# Patient Record
Sex: Male | Born: 1950 | Race: White | Hispanic: No | Marital: Married | Smoking: Never smoker
Health system: Southern US, Community
[De-identification: ages and names within clinical notes are randomized; demographics above are authoritative.]

## PROBLEM LIST (undated history)

## (undated) DIAGNOSIS — E785 Hyperlipidemia, unspecified: Secondary | ICD-10-CM

## (undated) DIAGNOSIS — H269 Unspecified cataract: Secondary | ICD-10-CM

## (undated) DIAGNOSIS — Z95820 Peripheral vascular angioplasty status with implants and grafts: Secondary | ICD-10-CM

## (undated) DIAGNOSIS — I2510 Atherosclerotic heart disease of native coronary artery without angina pectoris: Secondary | ICD-10-CM

## (undated) DIAGNOSIS — N400 Enlarged prostate without lower urinary tract symptoms: Secondary | ICD-10-CM

## (undated) DIAGNOSIS — N200 Calculus of kidney: Secondary | ICD-10-CM

## (undated) DIAGNOSIS — I213 ST elevation (STEMI) myocardial infarction of unspecified site: Secondary | ICD-10-CM

## (undated) DIAGNOSIS — D126 Benign neoplasm of colon, unspecified: Secondary | ICD-10-CM

## (undated) DIAGNOSIS — T7840XA Allergy, unspecified, initial encounter: Secondary | ICD-10-CM

## (undated) HISTORY — PX: NO PAST SURGERIES: SHX2092

## (undated) HISTORY — PX: CATARACT EXTRACTION: SUR2

## (undated) HISTORY — DX: Unspecified cataract: H26.9

## (undated) HISTORY — DX: Benign neoplasm of colon, unspecified: D12.6

## (undated) HISTORY — DX: Allergy, unspecified, initial encounter: T78.40XA

---

## 1999-07-11 ENCOUNTER — Encounter: Admission: RE | Admit: 1999-07-11 | Discharge: 1999-08-01 | Payer: Self-pay | Admitting: Family Medicine

## 2003-01-17 ENCOUNTER — Encounter: Admission: RE | Admit: 2003-01-17 | Discharge: 2003-03-03 | Payer: Self-pay | Admitting: Family Medicine

## 2004-06-09 DIAGNOSIS — D126 Benign neoplasm of colon, unspecified: Secondary | ICD-10-CM

## 2004-06-09 HISTORY — DX: Benign neoplasm of colon, unspecified: D12.6

## 2005-01-09 ENCOUNTER — Ambulatory Visit: Payer: Self-pay | Admitting: Family Medicine

## 2005-01-27 ENCOUNTER — Ambulatory Visit: Payer: Self-pay | Admitting: Gastroenterology

## 2005-02-21 ENCOUNTER — Encounter (INDEPENDENT_AMBULATORY_CARE_PROVIDER_SITE_OTHER): Payer: Self-pay | Admitting: *Deleted

## 2005-02-21 ENCOUNTER — Ambulatory Visit: Payer: Self-pay | Admitting: Gastroenterology

## 2005-03-05 ENCOUNTER — Ambulatory Visit: Payer: Self-pay | Admitting: Family Medicine

## 2005-03-13 ENCOUNTER — Ambulatory Visit: Payer: Self-pay | Admitting: Family Medicine

## 2005-05-14 ENCOUNTER — Ambulatory Visit: Payer: Self-pay | Admitting: Family Medicine

## 2005-05-30 ENCOUNTER — Ambulatory Visit (HOSPITAL_BASED_OUTPATIENT_CLINIC_OR_DEPARTMENT_OTHER): Admission: RE | Admit: 2005-05-30 | Discharge: 2005-05-30 | Payer: Self-pay | Admitting: Orthopedic Surgery

## 2005-05-30 ENCOUNTER — Ambulatory Visit (HOSPITAL_COMMUNITY): Admission: RE | Admit: 2005-05-30 | Discharge: 2005-05-30 | Payer: Self-pay | Admitting: Orthopedic Surgery

## 2005-07-03 ENCOUNTER — Encounter: Admission: RE | Admit: 2005-07-03 | Discharge: 2005-07-28 | Payer: Self-pay | Admitting: Orthopedic Surgery

## 2009-05-27 ENCOUNTER — Emergency Department (HOSPITAL_COMMUNITY): Admission: EM | Admit: 2009-05-27 | Discharge: 2009-05-27 | Payer: Self-pay | Admitting: Emergency Medicine

## 2010-01-22 ENCOUNTER — Encounter (INDEPENDENT_AMBULATORY_CARE_PROVIDER_SITE_OTHER): Payer: Self-pay | Admitting: *Deleted

## 2010-07-09 NOTE — Letter (Signed)
Summary: Colonoscopy Letter  [facility]  [address] [place], [place] [address]   Phone: [phone]  Fax: [phone]      January 22, 2010 MRN: [id number]   [facility] [address] [place], [place]  [address]   Dear Blake Evans,   According to your medical record, it is time for you to schedule a Colonoscopy. The American Cancer Society recommends this procedure as a method to detect early colon cancer. Patients with a family history of colon cancer, or a personal history of colon polyps or inflammatory bowel disease are at increased risk.  This letter has beeen generated based on the recommendations made at the time of your procedure. If you feel that in your particular situation this may no longer apply, please contact our office.  Please call our office at [phone] to schedule this appointment or to update your records at your earliest convenience.  Thank you for cooperating with [place] to provide you with the very best care possible.   Sincerely,  Judie Petit T. Russella Dar, M.D.  [facility] [phone]

## 2010-09-09 LAB — URINE CULTURE
Colony Count: NO GROWTH
Culture: NO GROWTH

## 2010-09-09 LAB — URINE MICROSCOPIC-ADD ON

## 2010-09-09 LAB — URINALYSIS, ROUTINE W REFLEX MICROSCOPIC
Bilirubin Urine: NEGATIVE
Glucose, UA: NEGATIVE mg/dL
Ketones, ur: 15 mg/dL — AB
Nitrite: NEGATIVE
Protein, ur: 30 mg/dL — AB
Specific Gravity, Urine: 1.025 (ref 1.005–1.030)
Urobilinogen, UA: 1 mg/dL (ref 0.0–1.0)
pH: 6.5 (ref 5.0–8.0)

## 2010-10-25 NOTE — Op Note (Signed)
NAMESWAYZE, PRIES                 ACCOUNT NO.:  [id number]   MEDICAL RECORD NO.:  [id number]          PATIENT TYPE:  AMB   LOCATION:  DSC                          FACILITY:  [facility]   PHYSICIAN:  Robert A. Thurston Hole, M.D. 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   PROCEDURE:  Right elbow EUA followed by lateral epicondylar release,  debridement and repair with partial lateral epicondylectomy.   SURGEON:  Elana Alm. Thurston Hole, M.D.   ASSISTANT:  Gifford Shave, P.A.   ANESTHESIA:  General.   OPERATIVE TIME:  45 minutes.   COMPLICATIONS:  None.   INDICATIONS FOR PROCEDURE:  Mr. Fix is a 60 year old gentleman who has had  significant right elbow pain laterally, longstanding, getting worse, with  exam and MRI done documenting a partial tear of the lateral epicondylar  tendons, who has failed conservative tear and is now to undergo debridement,  lateral epicondylar release, repair and partial lateral epicondylectomy.  Of  note, this morning he had a small amount of erythema along his olecranon  bursa, no definite cellulitis, but I elected to treat him with perioperative  antibiotics because of this.   Mr. Villalva is brought to the operating room on May 30, 2005, placed on  the operating table in the supine position.  After an adequate level of  general anesthesia was obtained, his right elbow was examined.  He had full  range of motion and his elbow was stable to ligamentous exam.  Right arm was  prepped using sterile DuraPrep and draped using sterile technique.  The arm  was exsanguinated and tourniquet elevated to 275 mmHg.  Initially through a  3- to 4-cm curvilinear anterolateral incision based over the lateral  epicondyle, initial exposure was made.  The underlying subcutaneous tissues  were incised in line with the skin incision.  The fascia over the lateral  epicondylar tendons was incised longitudinally and then retracted.  The ECRB  and ECRL tendons were then exposed.  They were carefully released off their  lateral epicondylar insertion and radial nerve carefully protected while  this was done.  There was found to be significant tendinosis and partial  tearing, and this was thoroughly debrided back to healthy-appearing tendon.  The radial-side capitellum joint was not entered.  After this debridement  had been carried out, underlying bone spur noted on lateral epicondyle was  resected, and then multiple small drill holes were placed in the lateral  epicondyle.  After this was done, then #2 FiberWire was passed through these  drill holes and through the ECRB and ECRL tendon in a mattress suture  technique and then tied down over bone, thus securing the lateral  epicondylar tendons back to the lateral epicondyle approximately 3 mm distal  to their original insertion under less tension.  After this had been done,  it was  felt that a complete and satisfactory debridement and repair had been  carried out.  The wound was then irrigated.  The fascia was then closed over  this repair with 2-0 Vicryl suture.  Subcutaneous tissues were closed with 2-  0 Vicryl.  Subcuticular layer was closed with 3-0 Monocryl.  Steri-Strips  were applied.  The wound was injected with 0.25% Marcaine.  Sterile  dressings and a long-arm splint applied.  Tourniquet was released.  Then the  patient was awakened and taken to the recovery room in stable condition.   FOLLOWUP CARE:  Mr. Peplinski will be followed as an outpatient on Percocet and  Keflex.  Will see him back in the office in a week for wound check and  followup.      Robert A. Thurston Hole, M.D.  Electronically Signed     RAW/MEDQ  D:  05/30/2005  T:  06/02/2005   Job:  [id number]

## 2011-07-28 ENCOUNTER — Ambulatory Visit (INDEPENDENT_AMBULATORY_CARE_PROVIDER_SITE_OTHER): Payer: 59 | Admitting: Physician Assistant

## 2011-07-28 VITALS — BP 147/90 | HR 77 | Temp 99.3°F | Resp 16 | Ht 71.0 in | Wt 176.0 lb

## 2011-07-28 DIAGNOSIS — J019 Acute sinusitis, unspecified: Secondary | ICD-10-CM

## 2011-07-28 DIAGNOSIS — R05 Cough: Secondary | ICD-10-CM

## 2011-07-28 DIAGNOSIS — R059 Cough, unspecified: Secondary | ICD-10-CM

## 2011-07-28 MED ORDER — BENZONATATE 100 MG PO CAPS: 100.0000 mg | ORAL_CAPSULE | Freq: Three times a day (TID) | ORAL | Status: AC | PRN

## 2011-07-28 MED ORDER — CEFDINIR 300 MG PO CAPS: 300.0000 mg | ORAL_CAPSULE | Freq: Two times a day (BID) | ORAL | Status: AC

## 2011-07-28 MED ORDER — IPRATROPIUM BROMIDE 0.06 % NA SOLN: 2.0000 | Freq: Two times a day (BID) | NASAL | Status: DC

## 2011-07-28 NOTE — Patient Instructions (Signed)

## 2011-07-28 NOTE — Progress Notes (Signed)
Patient ID: Blake Evans MRN: [id number], DOB: [birth year], 61 y.o. Date of Encounter: 07/28/2011, 8:43 AM  Primary Physician: Dr. Tawanna Cooler  Chief Complaint:  Chief Complaint  Patient presents with  . URI    * 1 week- worse yesterday  . Generalized Body Aches  . Fever    HPI: 61 y.o. year old male presents with 1 week history of worsening nasal congestion, sinus pressure, and post nasal drip. Symptoms worsened the previous night when he developed a fever of 101. This resolved with Tylenol. He notes a mild cough that is nonproductive. Main symptom currently is frontal sinus pressure that is worsening. Nasal congestion is thick and clear to white. Bilateral ears feel full. Has tried Nyquil/Dayquil without much success. Normal appetite. No nausea or vomiting. No GI symptoms. No sick contacts. Has up coming vacation to the mountains in 3 days.  Daughter is currently in [employer] school in [place].   No leg trauma, sedentary periods, h/o cancer, or tobacco use.  No past medical history on file.   Home Meds: Prior to Admission medications   Medication Sig Start Date End Date Taking? Authorizing Provider  Dutasteride-Tamsulosin HCl (JALYN) 0.5-0.4 MG CAPS Take 1 capsule by mouth daily.   Yes Historical Provider, MD  Multiple Vitamin (MULTIVITAMIN) tablet Take 1 tablet by mouth daily.   Yes Historical Provider, MD  saw palmetto 80 MG capsule Take 80 mg by mouth daily.   Yes Historical Provider, MD    Allergies: No Known Allergies  History   Social History  . Marital Status: Single    Spouse Name: N/A    Number of Children: N/A  . Years of Education: N/A   Occupational History  . Not on file.   Social History Main Topics  . Smoking status: Never Smoker   . Smokeless tobacco: Not on file  . Alcohol Use: Not on file  . Drug Use: Not on file  . Sexually Active: Not on file   Other Topics Concern  . Not on file   Social History Narrative  . No narrative on file     Review of  Systems: Constitutional: negative for chills, fever, night sweats or weight changes Cardiovascular: negative for chest pain or palpitations Respiratory: negative for hemoptysis, wheezing, or shortness of breath Abdominal: negative for abdominal pain, nausea, vomiting or diarrhea Dermatological: negative for rash Neurologic: negative for headache   Physical Exam: Blood pressure 147/90, pulse 77, temperature 99.3 F (37.4 C), temperature source Oral, resp. rate 16, height 5\' 11"  (1.803 m), weight 176 lb (79.833 kg)., Body mass index is 24.55 kg/(m^2). General: Well developed, well nourished, in no acute distress. Head: Normocephalic, atraumatic, eyes without discharge, sclera non-icteric, nares are congested. Bilateral auditory canals clear, TM's are without perforation, pearly grey with reflective cone of light bilaterally. Serous effusion bilateral TM. Frontal sinus TTP bilaterally.Oral cavity moist, dentition normal. Posterior pharynx with post nasal drip and mild erythema. No peritonsillar abscess or tonsillar exudate. Neck: Supple. No thyromegaly. Full ROM. No lymphadenopathy. Lungs: Clear bilaterally to auscultation without wheezes, rales, or rhonchi. Breathing is unlabored. Heart: RRR with S1 S2. No murmurs, rubs, or gallops appreciated. Msk:  Strength and tone normal for age. Extremities: No clubbing or cyanosis. No edema. Neuro: Alert and oriented X 3. Moves all extremities spontaneously. CNII-XII grossly in tact. Psych:  Responds to questions appropriately with a normal affect.    ASSESSMENT AND PLAN:  61 y.o. year old male with early sinusitis. -Omnicef 300 mg 1  po bid #20 no RF  -Atrovent NS 0.06% 2 sprays each nare bid prn #1 no RF -Tessalon Perles 100 mg 1 po tid prn cough #30 no RF  -Mucinex -Tylenol/Motrin prn -Rest/fluids -RTC precautions -RTC 3-5 days if no improvement  Signed, Eula Listen, PA-C 07/28/2011 8:43 AM

## 2011-11-21 ENCOUNTER — Inpatient Hospital Stay (HOSPITAL_COMMUNITY)
Admission: EM | Admit: 2011-11-21 | Discharge: 2011-11-25 | DRG: 247 | Disposition: A | Discharge status: Home / Self Care | Payer: 59 | Source: Ambulatory Visit | Attending: Cardiovascular Disease | Admitting: Cardiovascular Disease

## 2011-11-21 ENCOUNTER — Other Ambulatory Visit: Payer: Self-pay

## 2011-11-21 ENCOUNTER — Emergency Department (HOSPITAL_COMMUNITY): Payer: 59

## 2011-11-21 ENCOUNTER — Inpatient Hospital Stay (HOSPITAL_COMMUNITY): Payer: 59

## 2011-11-21 ENCOUNTER — Encounter (HOSPITAL_COMMUNITY)
Admission: EM | Disposition: A | Discharge status: Home / Self Care | Payer: Self-pay | Source: Ambulatory Visit | Attending: Cardiovascular Disease

## 2011-11-21 ENCOUNTER — Encounter (HOSPITAL_COMMUNITY): Payer: Self-pay | Admitting: Emergency Medicine

## 2011-11-21 DIAGNOSIS — I2510 Atherosclerotic heart disease of native coronary artery without angina pectoris: Secondary | ICD-10-CM | POA: Diagnosis present

## 2011-11-21 DIAGNOSIS — I2119 ST elevation (STEMI) myocardial infarction involving other coronary artery of inferior wall: Principal | ICD-10-CM | POA: Diagnosis present

## 2011-11-21 DIAGNOSIS — R001 Bradycardia, unspecified: Secondary | ICD-10-CM

## 2011-11-21 DIAGNOSIS — Z8249 Family history of ischemic heart disease and other diseases of the circulatory system: Secondary | ICD-10-CM

## 2011-11-21 DIAGNOSIS — E785 Hyperlipidemia, unspecified: Secondary | ICD-10-CM | POA: Diagnosis present

## 2011-11-21 DIAGNOSIS — N400 Enlarged prostate without lower urinary tract symptoms: Secondary | ICD-10-CM | POA: Diagnosis present

## 2011-11-21 DIAGNOSIS — I213 ST elevation (STEMI) myocardial infarction of unspecified site: Secondary | ICD-10-CM | POA: Diagnosis present

## 2011-11-21 DIAGNOSIS — Z95820 Peripheral vascular angioplasty status with implants and grafts: Secondary | ICD-10-CM

## 2011-11-21 HISTORY — PX: CORONARY ANGIOPLASTY WITH STENT PLACEMENT: SHX49

## 2011-11-21 HISTORY — DX: Atherosclerotic heart disease of native coronary artery without angina pectoris: I25.10

## 2011-11-21 HISTORY — DX: ST elevation (STEMI) myocardial infarction of unspecified site: I21.3

## 2011-11-21 HISTORY — DX: Calculus of kidney: N20.0

## 2011-11-21 HISTORY — DX: Hyperlipidemia, unspecified: E78.5

## 2011-11-21 HISTORY — DX: Benign prostatic hyperplasia without lower urinary tract symptoms: N40.0

## 2011-11-21 HISTORY — PX: PERCUTANEOUS CORONARY STENT INTERVENTION (PCI-S): SHX5485

## 2011-11-21 HISTORY — PX: LEFT HEART CATHETERIZATION WITH CORONARY ANGIOGRAM: SHX5451

## 2011-11-21 HISTORY — DX: Peripheral vascular angioplasty status with implants and grafts: Z95.820

## 2011-11-21 LAB — URINALYSIS, ROUTINE W REFLEX MICROSCOPIC
Bilirubin Urine: NEGATIVE
Bilirubin Urine: NEGATIVE
Glucose, UA: NEGATIVE mg/dL
Glucose, UA: NEGATIVE mg/dL
Ketones, ur: NEGATIVE mg/dL
Ketones, ur: NEGATIVE mg/dL
Leukocytes, UA: NEGATIVE
Leukocytes, UA: NEGATIVE
Nitrite: NEGATIVE
Nitrite: NEGATIVE
Protein, ur: NEGATIVE mg/dL
Protein, ur: NEGATIVE mg/dL
Specific Gravity, Urine: 1.046 — ABNORMAL HIGH (ref 1.005–1.030)
Specific Gravity, Urine: 1.023 (ref 1.005–1.030)
Urobilinogen, UA: 0.2 mg/dL (ref 0.0–1.0)
Urobilinogen, UA: 0.2 mg/dL (ref 0.0–1.0)
pH: 6.5 (ref 5.0–8.0)
pH: 7.5 (ref 5.0–8.0)

## 2011-11-21 LAB — CBC
HCT: 40.4 % (ref 39.0–52.0)
HCT: 44 % (ref 39.0–52.0)
Hemoglobin: 14.4 g/dL (ref 13.0–17.0)
Hemoglobin: 15.6 g/dL (ref 13.0–17.0)
MCH: 30.7 pg (ref 26.0–34.0)
MCH: 31 pg (ref 26.0–34.0)
MCHC: 35.5 g/dL (ref 30.0–36.0)
MCHC: 35.6 g/dL (ref 30.0–36.0)
MCV: 86.1 fL (ref 78.0–100.0)
MCV: 87.3 fL (ref 78.0–100.0)
Platelets: 181 10*3/uL (ref 150–400)
Platelets: 222 10*3/uL (ref 150–400)
RBC: 4.69 MIL/uL (ref 4.22–5.81)
RBC: 5.04 MIL/uL (ref 4.22–5.81)
RDW: 12.6 % (ref 11.5–15.5)
RDW: 12.8 % (ref 11.5–15.5)
WBC: 11.1 10*3/uL — ABNORMAL HIGH (ref 4.0–10.5)
WBC: 14.1 10*3/uL — ABNORMAL HIGH (ref 4.0–10.5)

## 2011-11-21 LAB — COMPREHENSIVE METABOLIC PANEL
ALT: 22 U/L (ref 0–53)
ALT: 32 U/L (ref 0–53)
AST: 23 U/L (ref 0–37)
AST: 87 U/L — ABNORMAL HIGH (ref 0–37)
Albumin: 3.9 g/dL (ref 3.5–5.2)
Albumin: 3.7 g/dL (ref 3.5–5.2)
Alkaline Phosphatase: 55 U/L (ref 39–117)
Alkaline Phosphatase: 50 U/L (ref 39–117)
BUN: 14 mg/dL (ref 6–23)
BUN: 13 mg/dL (ref 6–23)
CO2: 25 mEq/L (ref 19–32)
CO2: 21 mEq/L (ref 19–32)
Calcium: 9 mg/dL (ref 8.4–10.5)
Calcium: 8.4 mg/dL (ref 8.4–10.5)
Chloride: 99 mEq/L (ref 96–112)
Chloride: 100 mEq/L (ref 96–112)
Creatinine, Ser: 0.71 mg/dL (ref 0.50–1.35)
Creatinine, Ser: 0.67 mg/dL (ref 0.50–1.35)
GFR calc Af Amer: >90 mL/min (ref >90)
GFR calc Af Amer: >90 mL/min (ref >90)
GFR calc non Af Amer: >90 mL/min (ref >90)
GFR calc non Af Amer: >90 mL/min (ref >90)
Glucose, Bld: 103 mg/dL — ABNORMAL HIGH (ref 70–99)
Glucose, Bld: 129 mg/dL — ABNORMAL HIGH (ref 70–99)
Potassium: 3.5 mEq/L (ref 3.5–5.1)
Potassium: 4.2 mEq/L (ref 3.5–5.1)
Sodium: 137 mEq/L (ref 135–145)
Sodium: 134 mEq/L — ABNORMAL LOW (ref 135–145)
Total Bilirubin: 1.1 mg/dL (ref 0.3–1.2)
Total Bilirubin: 1 mg/dL (ref 0.3–1.2)
Total Protein: 7.1 g/dL (ref 6.0–8.3)
Total Protein: 6.3 g/dL (ref 6.0–8.3)

## 2011-11-21 LAB — HEMOGLOBIN A1C
Hgb A1c MFr Bld: 5.3 % (ref <5.7)
Mean Plasma Glucose: 105 mg/dL (ref <117)

## 2011-11-21 LAB — MAGNESIUM: Magnesium: 1.7 mg/dL (ref 1.5–2.5)

## 2011-11-21 LAB — CARDIAC PANEL(CRET KIN+CKTOT+MB+TROPI)
CK, MB: 57.7 ng/mL (ref 0.3–4.0)
CK, MB: 157.7 ng/mL (ref 0.3–4.0)
CK, MB: 102.9 ng/mL (ref 0.3–4.0)
Relative Index: 5.8 — ABNORMAL HIGH (ref 0.0–2.5)
Relative Index: 7 — ABNORMAL HIGH (ref 0.0–2.5)
Relative Index: 6.6 — ABNORMAL HIGH (ref 0.0–2.5)
Total CK: 996 U/L — ABNORMAL HIGH (ref 7–232)
Total CK: 2246 U/L — ABNORMAL HIGH (ref 7–232)
Total CK: 1563 U/L — ABNORMAL HIGH (ref 7–232)
Troponin I: 4.79 ng/mL (ref <0.30)
Troponin I: >25 ng/mL (ref <0.30)
Troponin I: >25 ng/mL (ref <0.30)

## 2011-11-21 LAB — POCT I-STAT, CHEM 8
BUN: 14 mg/dL (ref 6–23)
Calcium, Ion: 1.1 mmol/L — ABNORMAL LOW (ref 1.12–1.32)
Chloride: 102 mEq/L (ref 96–112)
Creatinine, Ser: 0.8 mg/dL (ref 0.50–1.35)
Glucose, Bld: 105 mg/dL — ABNORMAL HIGH (ref 70–99)
HCT: 46 % (ref 39.0–52.0)
Hemoglobin: 15.6 g/dL (ref 13.0–17.0)
Potassium: 3.7 mEq/L (ref 3.5–5.1)
Sodium: 140 mEq/L (ref 135–145)
TCO2: 23 mmol/L (ref 0–100)

## 2011-11-21 LAB — MRSA PCR SCREENING: MRSA by PCR: NEGATIVE

## 2011-11-21 LAB — APTT: aPTT: 24 seconds (ref 24–37)

## 2011-11-21 LAB — LIPID PANEL
Cholesterol: 193 mg/dL (ref 0–200)
HDL: 45 mg/dL (ref >39)
LDL Cholesterol: 116 mg/dL — ABNORMAL HIGH (ref 0–99)
Total CHOL/HDL Ratio: 4.3 RATIO
Triglycerides: 158 mg/dL — ABNORMAL HIGH (ref <150)
VLDL: 32 mg/dL (ref 0–40)

## 2011-11-21 LAB — PRO B NATRIURETIC PEPTIDE: Pro B Natriuretic peptide (BNP): 30.6 pg/mL (ref 0–125)

## 2011-11-21 LAB — PROTIME-INR
INR: 0.98 (ref 0.00–1.49)
Prothrombin Time: 13.2 seconds (ref 11.6–15.2)

## 2011-11-21 LAB — POCT I-STAT TROPONIN I: Troponin i, poc: 0 ng/mL (ref 0.00–0.08)

## 2011-11-21 LAB — POCT ACTIVATED CLOTTING TIME: Activated Clotting Time: 374 seconds

## 2011-11-21 LAB — URINE MICROSCOPIC-ADD ON

## 2011-11-21 LAB — TSH: TSH: 1.556 u[IU]/mL (ref 0.350–4.500)

## 2011-11-21 SURGERY — LEFT HEART CATHETERIZATION WITH CORONARY ANGIOGRAM
Anesthesia: LOCAL

## 2011-11-21 MED ORDER — NITROGLYCERIN IN D5W 200-5 MCG/ML-% IV SOLN
INTRAVENOUS | Status: AC
  Filled 2011-11-21: qty 250

## 2011-11-21 MED ORDER — TAMSULOSIN HCL 0.4 MG PO CAPS
0.4000 mg | ORAL_CAPSULE | Freq: Every day | ORAL | Status: DC
  Administered 2011-11-21 – 2011-11-25 (×5): 0.4 mg via ORAL
  Filled 2011-11-21 (×5): qty 1

## 2011-11-21 MED ORDER — MIDAZOLAM HCL 2 MG/2ML IJ SOLN
INTRAMUSCULAR | Status: AC
  Filled 2011-11-21: qty 2

## 2011-11-21 MED ORDER — ATORVASTATIN CALCIUM 40 MG PO TABS
40.0000 mg | ORAL_TABLET | Freq: Every day | ORAL | Status: DC
  Administered 2011-11-21 – 2011-11-24 (×4): 40 mg via ORAL
  Filled 2011-11-21 (×7): qty 1

## 2011-11-21 MED ORDER — ASPIRIN EC 81 MG PO TBEC: 81.0000 mg | DELAYED_RELEASE_TABLET | Freq: Every day | ORAL | Status: DC

## 2011-11-21 MED ORDER — SODIUM CHLORIDE 0.9 % IV SOLN
INTRAVENOUS | Status: AC
  Administered 2011-11-21: 03:00:00 via INTRAVENOUS

## 2011-11-21 MED ORDER — ZOLPIDEM TARTRATE 5 MG PO TABS: 10.0000 mg | ORAL_TABLET | Freq: Every evening | ORAL | Status: DC | PRN

## 2011-11-21 MED ORDER — ONDANSETRON HCL 4 MG/2ML IJ SOLN: 4.0000 mg | Freq: Four times a day (QID) | INTRAMUSCULAR | Status: DC | PRN

## 2011-11-21 MED ORDER — SODIUM CHLORIDE 0.9 % IV SOLN: INTRAVENOUS | Status: DC

## 2011-11-21 MED ORDER — HEPARIN (PORCINE) IN NACL 2-0.9 UNIT/ML-% IJ SOLN
INTRAMUSCULAR | Status: AC
  Filled 2011-11-21: qty 2000

## 2011-11-21 MED ORDER — ASPIRIN EC 325 MG PO TBEC
325.0000 mg | DELAYED_RELEASE_TABLET | Freq: Every day | ORAL | Status: DC
  Administered 2011-11-21: 325 mg via ORAL
  Filled 2011-11-21 (×2): qty 1

## 2011-11-21 MED ORDER — HEPARIN BOLUS VIA INFUSION
4000.0000 [IU] | Freq: Once | INTRAVENOUS | Status: AC
  Administered 2011-11-21: 4000 [IU] via INTRAVENOUS

## 2011-11-21 MED ORDER — PRASUGREL HCL 10 MG PO TABS
ORAL_TABLET | ORAL | Status: AC
  Filled 2011-11-21: qty 6

## 2011-11-21 MED ORDER — DUTASTERIDE 0.5 MG PO CAPS
0.5000 mg | ORAL_CAPSULE | Freq: Every day | ORAL | Status: DC
  Administered 2011-11-21 – 2011-11-25 (×5): 0.5 mg via ORAL
  Filled 2011-11-21 (×5): qty 1

## 2011-11-21 MED ORDER — ASPIRIN 81 MG PO CHEW
CHEWABLE_TABLET | ORAL | Status: AC
  Filled 2011-11-21: qty 4

## 2011-11-21 MED ORDER — PRASUGREL HCL 10 MG PO TABS
10.0000 mg | ORAL_TABLET | Freq: Every day | ORAL | Status: DC
  Administered 2011-11-21 – 2011-11-25 (×5): 10 mg via ORAL
  Filled 2011-11-21 (×5): qty 1

## 2011-11-21 MED ORDER — METOPROLOL TARTRATE 12.5 MG HALF TABLET
12.5000 mg | ORAL_TABLET | Freq: Two times a day (BID) | ORAL | Status: DC
  Administered 2011-11-21 – 2011-11-25 (×8): 12.5 mg via ORAL
  Filled 2011-11-21 (×11): qty 1

## 2011-11-21 MED ORDER — ASPIRIN 81 MG PO CHEW
324.0000 mg | CHEWABLE_TABLET | Freq: Once | ORAL | Status: AC
  Administered 2011-11-21: 162 mg via ORAL
  Filled 2011-11-21: qty 4

## 2011-11-21 MED ORDER — DUTASTERIDE-TAMSULOSIN HCL 0.5-0.4 MG PO CAPS: 1.0000 | ORAL_CAPSULE | Freq: Every day | ORAL | Status: DC

## 2011-11-21 MED ORDER — NITROGLYCERIN 0.4 MG SL SUBL
SUBLINGUAL_TABLET | SUBLINGUAL | Status: AC
  Filled 2011-11-21: qty 25

## 2011-11-21 MED ORDER — LISINOPRIL 2.5 MG PO TABS
2.5000 mg | ORAL_TABLET | Freq: Two times a day (BID) | ORAL | Status: DC
  Administered 2011-11-21 – 2011-11-25 (×8): 2.5 mg via ORAL
  Filled 2011-11-21 (×11): qty 1

## 2011-11-21 MED ORDER — ACETAMINOPHEN 325 MG PO TABS: 650.0000 mg | ORAL_TABLET | ORAL | Status: DC | PRN

## 2011-11-21 MED ORDER — HEPARIN SODIUM (PORCINE) 5000 UNIT/ML IJ SOLN: 60.0000 [IU]/kg | INTRAMUSCULAR | Status: DC

## 2011-11-21 MED ORDER — NITROGLYCERIN 0.4 MG SL SUBL: 0.4000 mg | SUBLINGUAL_TABLET | SUBLINGUAL | Status: DC | PRN

## 2011-11-21 MED ORDER — ATROPINE SULFATE 1 MG/ML IJ SOLN
INTRAMUSCULAR | Status: AC
  Filled 2011-11-21: qty 1

## 2011-11-21 MED ORDER — LIDOCAINE HCL (PF) 1 % IJ SOLN
INTRAMUSCULAR | Status: AC
  Filled 2011-11-21: qty 30

## 2011-11-21 MED ORDER — MORPHINE SULFATE 2 MG/ML IJ SOLN: 1.0000 mg | INTRAMUSCULAR | Status: DC | PRN

## 2011-11-21 MED ORDER — NITROGLYCERIN 0.2 MG/ML ON CALL CATH LAB
INTRAVENOUS | Status: AC
  Filled 2011-11-21: qty 1

## 2011-11-21 MED ORDER — HEPARIN (PORCINE) IN NACL 100-0.45 UNIT/ML-% IJ SOLN
INTRAMUSCULAR | Status: AC
  Filled 2011-11-21: qty 250

## 2011-11-21 MED ORDER — ADULT MULTIVITAMIN W/MINERALS CH
1.0000 | ORAL_TABLET | Freq: Every day | ORAL | Status: DC
  Administered 2011-11-21 – 2011-11-24 (×4): 1 via ORAL
  Filled 2011-11-21 (×4): qty 1

## 2011-11-21 MED ORDER — BIVALIRUDIN 250 MG IV SOLR
INTRAVENOUS | Status: AC
  Filled 2011-11-21: qty 250

## 2011-11-21 MED ORDER — NITROGLYCERIN IN D5W 200-5 MCG/ML-% IV SOLN
10.0000 ug/min | INTRAVENOUS | Status: DC
  Administered 2011-11-21: 10 ug/min via INTRAVENOUS

## 2011-11-21 NOTE — H&P (Signed)
Blake Evans is an 61 y.o. male.    PCP:  Dr. Tawanna Cooler  Chief Complaint: chest pain, upper chest HPI: 61 year old WM, with no prior cardiac history, does have a history of BPH and family history of CAD, presented to [facility] ER with upper chest pain.  Pain started within the last hour.  Associated symptoms include nausea,  Diaphoresis, and SOB.  In ER St elevation in leads II, III, AVF, and minimal ST elevation V4-6, with recipical changes in I, AVR, AVF.  Given 324mg  ASA in ER, IV Heparin and transported emergently to cath lab at [facility].  Also in ED HR down to 51, S. Huston Foley.  Past Medical History  Diagnosis Date  . Hypercholesteremia   . STEMI (ST elevation myocardial infarction), ST elevation in inf. wall 11/21/2011  . BPH (benign prostatic hyperplasia) 11/21/2011    Past Surgical History  Procedure Date  . No past surgeries     Family History  Problem Relation Age of Onset  . Coronary artery disease Father    Social History:  reports that he has never smoked. He does not have any smokeless tobacco history on file. He reports that he does not use illicit drugs. His alcohol history not on file.  Allergies: No Known Allergies  Medications Prior to Admission  Medication Sig Dispense Refill  . Dutasteride-Tamsulosin HCl (JALYN) 0.5-0.4 MG CAPS Take 1 capsule by mouth daily.      Marland Kitchen ipratropium (ATROVENT) 0.06 % nasal spray Place 2 sprays into the nose 2 (two) times daily.  15 mL  0  . Multiple Vitamin (MULTIVITAMIN) tablet Take 1 tablet by mouth daily.      . saw palmetto 80 MG capsule Take 80 mg by mouth daily.        Results for orders placed during the hospital encounter of 11/21/11 (from the past 48 hour(s))  APTT     Status: Normal   Collection Time   11/21/11 12:24 AM      Component Value Range Comment   aPTT 24  24 - 37 seconds   CBC     Status: Abnormal   Collection Time   11/21/11 12:24 AM      Component Value Range Comment   WBC 11.1 (*) 4.0 - 10.5 K/uL    RBC 5.04  4.22 - 5.81  MIL/uL    Hemoglobin 15.6  13.0 - 17.0 g/dL    HCT 16.1  09.6 - 04.5 %    MCV 87.3  78.0 - 100.0 fL    MCH 31.0  26.0 - 34.0 pg    MCHC 35.5  30.0 - 36.0 g/dL    RDW 40.9  81.1 - 91.4 %    Platelets 222  150 - 400 K/uL   PROTIME-INR     Status: Normal   Collection Time   11/21/11 12:24 AM      Component Value Range Comment   Prothrombin Time 13.2  11.6 - 15.2 seconds    INR 0.98  0.00 - 1.49   POCT I-STAT TROPONIN I     Status: Normal   Collection Time   11/21/11 12:28 AM      Component Value Range Comment   Troponin i, poc 0.00  0.00 - 0.08 ng/mL    Comment 3            POCT I-STAT, CHEM 8     Status: Abnormal   Collection Time   11/21/11 12:30 AM  Component Value Range Comment   Sodium 140  135 - 145 mEq/L    Potassium 3.7  3.5 - 5.1 mEq/L    Chloride 102  96 - 112 mEq/L    BUN 14  6 - 23 mg/dL    Creatinine, Ser 4.78  0.50 - 1.35 mg/dL    Glucose, Bld 295 (*) 70 - 99 mg/dL    Calcium, Ion 6.21 (*) 1.12 - 1.32 mmol/L    TCO2 23  0 - 100 mmol/L    Hemoglobin 15.6  13.0 - 17.0 g/dL    HCT 30.8  65.7 - 84.6 %    No results found.  ROS: General:no colds or fevers Skin:no rashes or ulcers HEENT:wears glasses, no blurred vision NG:EXBMW pain, tonight only PUL:+ SOB tonight GI:No diarrhea, constipation or melena GU:no hematuria, no dysuria MS:no arthritic pain Neuro:no lightheadedness, no history of CVA Endo:no diabetes, no thyroid disease   Blood pressure 136/93, pulse 54, temperature 98 F (36.7 C), temperature source Oral, resp. rate 16, height 5\' 11"  (1.803 m), weight 77.111 kg (170 lb), SpO2 99.00%. PE: General:alert and oriented, still with chest pain 2-3/10 down from 5-6/10 Skin:W&D, brisk capillary refill HEENT:normocephalic, sclera clear Neck:supple, no JVD, no bruits Heart:S1S2 RRR, no murmur, gallup or rub Lungs:clear ant, without rales, rhonchi or wheezes  Abd:+ BS, soft, non tender, do not palpate masses Ext:no edema, 2-3+ post tib pulses, 2+ radial  pulses Neuro:alert and oriented X 3, MAE, follows commands    Assessment/Plan Patient Active Problem List  Diagnosis  . STEMI (ST elevation myocardial infarction), ST elevation in inf. wall  . BPH (benign prostatic hyperplasia)   PLAN: To cath lab emergently.  Then to CCU.  Serial CKMBs, no betablocker due to bradycardia.  Check cholesterol in am.     Madisun Hargrove R 11/21/2011, 1:07 AM

## 2011-11-21 NOTE — ED Notes (Signed)
Provided support to pt and family in ED and Cardiac Cath Lab of [place].

## 2011-11-21 NOTE — ED Provider Notes (Signed)
History     CSN: [id number]  Arrival date & time 11/21/11  0012   First MD Initiated Contact with Patient 11/21/11 0022      Chief Complaint  Patient presents with  . Chest Pain    (Consider location/radiation/quality/duration/timing/severity/associated sxs/prior treatment) HPI This is a 61 year old white male with a history of hypercholesterolemia. He is here with about 30 minutes of chest pain. The chest pain is precordial and is described as pressure, like a small person standing on his chest. It is accompanied by shortness of breath, clammy skin and transient nausea. He was noted to be pale on arrival. He has not vomited. He has no history of coronary artery disease.  Past Medical History  Diagnosis Date  . Hypercholesteremia     History reviewed. No pertinent past surgical history.  No family history on file.  History  Substance Use Topics  . Smoking status: Never Smoker   . Smokeless tobacco: Not on file  . Alcohol Use: Not on file     occasional      Review of Systems  All other systems reviewed and are negative.    Allergies  Review of patient's allergies indicates no known allergies.  Home Medications   Current Outpatient Rx  Name Route Sig Dispense Refill  . DUTASTERIDE-TAMSULOSIN HCL 0.5-0.4 MG PO CAPS Oral Take 1 capsule by mouth daily.    . IPRATROPIUM BROMIDE 0.06 % NA SOLN Nasal Place 2 sprays into the nose 2 (two) times daily. 15 mL 0  . ONE-DAILY MULTI VITAMINS PO TABS Oral Take 1 tablet by mouth daily.    . SAW PALMETTO (SERENOA REPENS) 80 MG PO CAPS Oral Take 80 mg by mouth daily.      BP 136/93  Pulse 54  Temp 98 F (36.7 C) (Oral)  Resp 16  Ht 5\' 11"  (1.803 m)  Wt 170 lb (77.111 kg)  BMI 23.71 kg/m2  SpO2 99%  Physical Exam General: Well-developed, well-nourished male in no acute distress; appearance consistent with age of record HENT: normocephalic, atraumatic Eyes: pupils equal round and reactive to light; extraocular muscles  intact Neck: supple Heart: regular rate and rhythm; bradycardia Lungs: clear to auscultation bilaterally Abdomen: soft; nondistended; nontender Extremities: No deformity; full range of motion; pulses normal; no edema; Percocet his right lower Neurologic: Awake, alert and oriented; motor function intact in all extremities and symmetric; no facial droop Skin: Pale and slightly clammy Psychiatric: Mildly anxious    ED Course  Procedures (including critical care time)  CRITICAL CARE Performed by: Jemmie Ledgerwood L   Total critical care time: 35 minutes  Critical care time was exclusive of separately billable procedures and treating other patients.  Critical care was necessary to treat or prevent imminent or life-threatening deterioration.  Critical care was time spent personally by me on the following activities: development of treatment plan with patient and/or surrogate as well as nursing, discussions with consultants, evaluation of patient's response to treatment, examination of patient, obtaining history from patient or surrogate, ordering and performing treatments and interventions, ordering and review of laboratory studies, ordering and review of radiographic studies, pulse oximetry and re-evaluation of patient's condition.    MDM   Nursing notes and vitals signs, including pulse oximetry, reviewed.  Summary of this visit's results, reviewed by myself:  Labs:  Results for orders placed during the hospital encounter of 11/21/11  CBC      Component Value Range   WBC 11.1 (*) 4.0 - 10.5 K/uL   RBC  5.04  4.22 - 5.81 MIL/uL   Hemoglobin 15.6  13.0 - 17.0 g/dL   HCT 40.9  81.1 - 91.4 %   MCV 87.3  78.0 - 100.0 fL   MCH 31.0  26.0 - 34.0 pg   MCHC 35.5  30.0 - 36.0 g/dL   RDW 78.2  95.6 - 21.3 %   Platelets 222  150 - 400 K/uL  POCT I-STAT, CHEM 8      Component Value Range   Sodium 140  135 - 145 mEq/L   Potassium 3.7  3.5 - 5.1 mEq/L   Chloride 102  96 - 112 mEq/L   BUN  14  6 - 23 mg/dL   Creatinine, Ser 0.86  0.50 - 1.35 mg/dL   Glucose, Bld 578 (*) 70 - 99 mg/dL   Calcium, Ion 4.69 (*) 1.12 - 1.32 mmol/L   TCO2 23  0 - 100 mmol/L   Hemoglobin 15.6  13.0 - 17.0 g/dL   HCT 62.9  52.8 - 41.3 %  POCT I-STAT TROPONIN I      Component Value Range   Troponin i, poc 0.00  0.00 - 0.08 ng/mL   Comment 3             Imaging Studies: No results found.  EKG Interpretation:  Date & Time: 11/21/2011 12:16 AM  Rate: 51  Rhythm: sinus bradycardia  QRS Axis: normal  Intervals: normal  ST/T Wave abnormalities: ST elevations inferiorly  Conduction Disutrbances:first-degree A-V block   Narrative Interpretation: Inferior myocardial infarction  Old EKG Reviewed: none available  EKG Interpretation:  Date & Time: 11/21/2011 12:30 AM  Rate: 53  Rhythm: sinus bradycardia  QRS Axis: normal  Intervals: normal  ST/T Wave abnormalities: ST elevations inferiorly  Conduction Disutrbances:first-degree A-V block   Narrative Interpretation: Inferior myocardial infarction  Old EKG Reviewed: unchanged  The patient was declared a Code STEMI at 12:19 AM. He was placed on the monitor, supplemental oxygen was applied, and he was given aspirin 324 mg by mouth. He was given heparin 4000 units IV bolus followed by 1000 units per hour drip. He remained stable in the ED. He was transferred by [facility] to the [facility] cardiac cath lab.          Hanley Seamen, MD 11/21/11 [phone]

## 2011-11-21 NOTE — Op Note (Signed)
Blake Evans is a 60 y.o. male    [id number] LOCATION:  FACILITY: [facility]  PHYSICIAN: Blake Evans, M.D. 08-24-1950   DATE OF PROCEDURE:  11/21/2011  DATE OF DISCHARGE:  [facility]  CARDIAC CATHETERIZATION     History obtained from chart review. 61 year old WM, with no prior cardiac history, does have a history of BPH and family history of CAD, presented to [facility] ER with upper chest pain. Pain started within the last hour. Associated symptoms include nausea, Diaphoresis, and SOB. In ER St elevation in leads II, III, AVF, and minimal ST elevation V4-6, with recipical changes in I, AVR, AVF. Given 324mg  ASA in ER, IV Heparin and transported emergently to cath lab at [facility].    PROCEDURE DESCRIPTION:    The patient was brought to the second floor  [facility]iac cath lab in the postabsorptive state. He was not  Premedicated. His right groin was prepped and shaved in usual sterile fashion. Xylocaine 1% was used  for local anesthesia. A 6 French sheath was inserted into the right common femoral  artery using standard Seldinger technique. The patient received  4000 units  of heparin  Intravenously in the emergency room.    HEMODYNAMICS:    AO SYSTOLIC/AO DIASTOLIC: 141/85   LV SYSTOLIC/LV DIASTOLIC: 142/20  ANGIOGRAPHIC RESULTS:   1. Left main; normal  2. LAD; occluded after the second diagonal branch. The first branch of a 80% segmental proximal stenosis and was a small vessel. The second diagonal branch had a 99% ostial stenosis there was a moderate size vessel. 3. Left circumflex; 80-90% mid AV groove stenosis.  4. Right coronary artery; this was the infarct related artery" and was occluded at the "crux " 5.LIMA was subselectively visualized and was widely patent. It was suitable for use during for aortic bypass grafting if necessary. 6. Left ventriculography; RAO left ventriculogram was performed using  25 mL of Visipaque dye at 12 mL/second.  The overall LVEF estimated  50-55 %  With wall motion abnormalities notable for mild inferobasal hypokinesia  IMPRESSION:Blake Evans has an acute inferior wall myocardial infarction related to an occluded distal dominant RCA. 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 Will proceed with PCI and stenting using Effient,  Angiomax, and drug-eluting stent.  Procedures description: Using a 6 [place] JR 4 guide catheter along the O14/190 cm length Asahi soft wire and a 2 mm x 12 mm long emerge balloon predilatation was performed. Following this, stenting was performed with a 3 mm x 12 mm long resolute drug-eluting stent deployed at 16 atmospheres. Postdilatation was performed with a 3.25 mm x 8 mm long [place] apex at 16 ATM  (3.3 mm) resulting in reduction of a total distal RCA occlusion to 0% residual with TIMI-3 flow. The patient did have brief AIVR reperfusion arrhythmias.  Final impression: Successful PCI and stenting of the distal RCA in the setting of an inferior STEMI using a resolute drug-eluting stent, aspirin, Effient and Angiomax. The door to balloon time was calculated at 33 minutes. The Angiomax infusion was reduced to low dose. The guidewire and catheter were removed and the sheath was sewn securely in place. The patient left the lab in stable condition. He'll be treated with a beta blocker, an ACE inhibitor, statin drug. We placed him on low-dose intravenous nitroglycerin at the end of the case. He will need staged intervention of his LAD diagonal branch and circumflex coronary arteries prior to  discharge from the hospital.  Blake Evans. MD, [facility] 11/21/2011 2:09 AM

## 2011-11-21 NOTE — Progress Notes (Signed)
CRITICAL VALUE ALERT  Critical value received:  Troponin=4.79, CKMB=57.7  Date of notification:  11-21-11  Time of notification:  0235  Critical value read back:yes  Nurse who received alert:  Montine Circle  MD notified (1st page):  N/A  Time of first page:  N/A  Value expected, call to provider not indicated. Blake Evans

## 2011-11-21 NOTE — H&P (Signed)
    Pt was reexamined and existing H & P reviewed. No changes found.  [name], MD [facility] 11/21/2011 2:06 AM

## 2011-11-21 NOTE — Progress Notes (Signed)
Subjective: Denied chest pain for now  Objective: Vital signs in last 24 hours: Temp:  [98 F (36.7 C)-98.4 F (36.9 C)] 98.4 F (36.9 C) (06/14 0400) Pulse Rate:  [53-72] 59  (06/14 0700) Resp:  [14-23] 14  (06/14 0700) BP: (126-139)/(79-93) 139/91 mmHg (06/14 0700) SpO2:  [97 %-100 %] 99 % (06/14 0700) Arterial Line BP: (134-182)/(72-96) 172/95 mmHg (06/14 0700) Weight:  [77.111 kg (170 lb)-79.6 kg (175 lb 7.8 oz)] 79.6 kg (175 lb 7.8 oz) (06/14 0234) Weight change:  Last BM Date: 11/20/11 Intake/Output from previous day: -788 06/13 0701 - 06/14 0700 In: 364.2 [I.V.:364.2] Out: 1075 [Urine:1075] Intake/Output this shift:    PE: General:alert and orientedX 3,no complaints Heart:S1S2 RRR, no murmur, gallup, rub or click Lungs:clear ant. Abd:+ BS, soft, non tender Ext:no edema 2+ post tib bil Neuro:alert and oriented   Lab Results:  Basename 11/21/11 0232 11/21/11 0030 11/21/11 0024  WBC 14.1* -- 11.1*  HGB 14.4 15.6 --  HCT 40.4 46.0 --  PLT 181 -- 222   BMET  Basename 11/21/11 0232 11/21/11 0030 11/21/11 0024  NA 134* 140 --  K 4.2 3.7 --  CL 100 102 --  CO2 21 -- 25  GLUCOSE 129* 105* --  BUN 13 14 --  CREATININE 0.67 0.80 --  CALCIUM 8.4 -- 9.0    Basename 11/21/11 0234  TROPONINI 4.79*    Lab Results  Component Value Date   CHOL 193 11/21/2011   HDL 45 11/21/2011   LDLCALC 116* 11/21/2011   TRIG 158* 11/21/2011   CHOLHDL 4.3 11/21/2011   No results found for this basename: HGBA1C     No results found for this basename: TSH    Hepatic Function Panel  Basename 11/21/11 0232  PROT 6.3  ALBUMIN 3.7  AST 87*  ALT 32  ALKPHOS 50  BILITOT 1.0  BILIDIR --  IBILI --    Basename 11/21/11 0234  CHOL 193   No results found for this basename: PROTIME in the last 72 hours    EKG: Orders placed during the hospital encounter of 11/21/11  . EKG 12-LEAD  . EKG 12-LEAD  . EKG 12-LEAD  . EKG 12-LEAD  . EKG 12-LEAD  . EKG 12-LEAD  . EKG  12-LEAD  . EKG 12-LEAD  . EKG 12-LEAD  . EKG 12-LEAD  . EKG 12-LEAD  . EKG 12-LEAD  . EKG 12-LEAD  . EKG 12-LEAD  . EKG 12-LEAD    Studies/Results: No results found.  Medications: I have reviewed the patient's current medications.    Marland Kitchen aspirin  324 mg Oral Once  . aspirin EC  325 mg Oral Daily  . atorvastatin  40 mg Oral q1800  . bivalirudin      . dutasteride  0.5 mg Oral Daily  . heparin      . heparin      . heparin      . heparin  4,000 Units Intravenous Once  . lidocaine      . midazolam      . multivitamin with minerals  1 tablet Oral Daily  . nitroGLYCERIN      . prasugrel      . prasugrel  10 mg Oral Daily  . Tamsulosin HCl  0.4 mg Oral Daily  . DISCONTD: aspirin EC  81 mg Oral Daily  . DISCONTD: Dutasteride-Tamsulosin HCl  1 capsule Oral Daily  . DISCONTD: heparin  60 Units/kg Intravenous STAT   Assessment/Plan: Patient Active Problem List  Diagnosis  . STEMI (ST elevation myocardial infarction), ST elevation in inf. wall, s/p DES resolute to RCA 11/21/11  . BPH (benign prostatic hyperplasia)  . CAD (coronary artery disease), Residual disease of LAD and LCX   PLAN: add ACE and beta blocker very low dose and monitor heart rate, though over all HR is improved.   Plan to recover over the weekend and then plan staged intervention Monday.   Continues on low dose NTG IV at 10 mcgs EKG improved this am. Urine with blood cells, will send C&S.  LOS: 0 days   INGOLD,LAURA R 11/21/2011, 7:24 AM  Agree with note written by Nada Boozer RNP  S/P PCI/Stent distal dominant RCA in setting of inferior STEMI with DTBT of 33 min and only minimal inferobasal hypokinesis with preserved LV fxn. No CP. Exam benign. Trop aprox 4. VSS. EKG with only small inferior Q waves. On low dose IV NTG. Sheath still in place. Will start BB and ACE-I. Transfer to tele tomorrow. Planned staged PCI/Stent LAD/Diag +/- LCX Monday.  [name] 11/21/2011 7:54 AM

## 2011-11-21 NOTE — Care Management Note (Signed)
    Page 1 of 1   11/21/2011     9:29:47 AM   CARE MANAGEMENT NOTE 11/21/2011  Patient:  NAKIA, KOBLE   Account Number:  [id number]  Date Initiated:  11/21/2011  Documentation initiated by:  Junius Creamer  Subjective/Objective Assessment:   adm w mi     Action/Plan:   Anticipated DC Date:     Anticipated DC Plan:        DC Planning Services  CM consult      Choice offered to / List presented to:             Status of service:   Medicare Important Message given?   (If response is "NO", the following Medicare IM given date fields will be blank) Date Medicare IM given:   Date Additional Medicare IM given:    Discharge Disposition:    Per UR Regulation:  Reviewed for med. necessity/level of care/duration of stay  If discussed at Long Length of Stay Meetings, dates discussed:    Comments:  6/14 9:30a debbie Tuesday Terlecki rn,bsn [phone] will get cm sec to ck on copay for effient.

## 2011-11-21 NOTE — ED Notes (Signed)
Patient started to have chest pain. Started early this evening

## 2011-11-22 LAB — CBC
HCT: 41.8 % (ref 39.0–52.0)
Hemoglobin: 14.6 g/dL (ref 13.0–17.0)
MCH: 30.3 pg (ref 26.0–34.0)
MCHC: 34.9 g/dL (ref 30.0–36.0)
MCV: 86.7 fL (ref 78.0–100.0)
Platelets: 173 10*3/uL (ref 150–400)
RBC: 4.82 MIL/uL (ref 4.22–5.81)
RDW: 12.8 % (ref 11.5–15.5)
WBC: 12.1 10*3/uL — ABNORMAL HIGH (ref 4.0–10.5)

## 2011-11-22 LAB — BASIC METABOLIC PANEL
BUN: 13 mg/dL (ref 6–23)
CO2: 25 mEq/L (ref 19–32)
Calcium: 8.9 mg/dL (ref 8.4–10.5)
Chloride: 100 mEq/L (ref 96–112)
Creatinine, Ser: 0.81 mg/dL (ref 0.50–1.35)
GFR calc Af Amer: >90 mL/min (ref >90)
GFR calc non Af Amer: >90 mL/min (ref >90)
Glucose, Bld: 103 mg/dL — ABNORMAL HIGH (ref 70–99)
Potassium: 4.5 mEq/L (ref 3.5–5.1)
Sodium: 135 mEq/L (ref 135–145)

## 2011-11-22 LAB — URINE CULTURE
Colony Count: 10000
Culture  Setup Time: 201306140928

## 2011-11-22 LAB — CARDIAC PANEL(CRET KIN+CKTOT+MB+TROPI)
CK, MB: 35.8 ng/mL (ref 0.3–4.0)
Relative Index: 4.7 — ABNORMAL HIGH (ref 0.0–2.5)
Total CK: 766 U/L — ABNORMAL HIGH (ref 7–232)
Troponin I: >20 ng/mL (ref <0.30)

## 2011-11-22 MED ORDER — ASPIRIN EC 81 MG PO TBEC
81.0000 mg | DELAYED_RELEASE_TABLET | Freq: Every day | ORAL | Status: DC
  Administered 2011-11-22 – 2011-11-23 (×2): 81 mg via ORAL
  Filled 2011-11-22 (×3): qty 1

## 2011-11-22 MED ORDER — ASPIRIN 81 MG PO CHEW
CHEWABLE_TABLET | ORAL | Status: AC
  Filled 2011-11-22: qty 1

## 2011-11-22 NOTE — Progress Notes (Signed)
Subjective: No chest pain, No SOB.  No complaints.  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  Objective: Vital signs in last 24 hours: Temp:  [98.3 F (36.8 C)-99.4 F (37.4 C)] 98.6 F (37 C) (06/15 0725) Pulse Rate:  [61-72] 62  (06/14 2222) Resp:  [14-19] 18  (06/15 0400) BP: (92-159)/(43-98) 92/43 mmHg (06/15 0725) SpO2:  [95 %-99 %] 97 % (06/15 0725) Arterial Line BP: (179)/(96) 179/96 mmHg (06/14 0800) Weight:  [77.7 kg (171 lb 4.8 oz)] 77.7 kg (171 lb 4.8 oz) (06/15 0400) Weight change: 0.589 kg (1 lb 4.8 oz) Last BM Date: 11/20/11 Intake/Output from previous day: +1120 06/14 0701 - 06/15 0700 In: 1389 [P.O.:1140; I.V.:249] Out: 350 [Urine:350] Intake/Output this shift:    PE: General:alert and oriented Heart:S1S2 RRR no murmur, gallup or rub Lungs:clear without rales, rhonchi or wheezes Abd:+ BS, soft non tender Ext:no edema Neuro:alert and oriented X 3    EKG:  Mena Pauls with evolutionary changes of Inf. MI.  Lab Results:  Basename 11/22/11 0540 11/21/11 0232  WBC 12.1* 14.1*  HGB 14.6 14.4  HCT 41.8 40.4  PLT 173 181   BMET  Basename 11/22/11 0540 11/21/11 0232  NA 135 134*  K 4.5 4.2  CL 100 100  CO2 25 21  GLUCOSE 103* 129*  BUN 13 13  CREATININE 0.81 0.67  CALCIUM 8.9 8.4    Basename 11/21/11 1830 11/21/11 1024  TROPONINI >25.00* >25.00*    Lab Results  Component Value Date   CHOL 193 11/21/2011   HDL 45 11/21/2011   LDLCALC 116* 11/21/2011   TRIG 158* 11/21/2011   CHOLHDL 4.3 11/21/2011   Lab Results  Component Value Date   HGBA1C 5.3 11/21/2011     Lab Results  Component Value Date   TSH 1.556 11/21/2011    Hepatic Function Panel  Basename 11/21/11 0232  PROT 6.3  ALBUMIN 3.7  AST 87*  ALT 32  ALKPHOS 50  BILITOT 1.0  BILIDIR --  IBILI --    Basename 11/21/11 0234  CHOL 193   No results found for this basename: PROTIME in the last 72 hours    EKG: Orders placed during the hospital encounter of 11/21/11    . EKG 12-LEAD  . EKG 12-LEAD  . EKG 12-LEAD  . EKG 12-LEAD  . EKG 12-LEAD  . EKG 12-LEAD  . EKG 12-LEAD  . EKG 12-LEAD  . EKG 12-LEAD  . EKG 12-LEAD  . EKG 12-LEAD  . EKG 12-LEAD  . EKG 12-LEAD  . EKG 12-LEAD  . EKG 12-LEAD  . EKG  . EKG 12-LEAD  . EKG 12-LEAD  . EKG 12-LEAD    Studies/Results: Dg Chest Port 1 View  11/21/2011  *RADIOLOGY REPORT*  Clinical Data: Myocardial infarction  PORTABLE CHEST - 1 VIEW  Comparison: None.  Findings: No active infiltrate or effusion is seen.  Mediastinal contours appear normal.  The heart is within upper limits of normal.  No bony abnormality is seen.  IMPRESSION: No active lung disease.  Original Report Authenticated By: Juline Patch, M.D.    Medications: I have reviewed the patient's current medications.    Marland Kitchen aspirin EC  325 mg Oral Daily  . atorvastatin  40 mg Oral q1800  . dutasteride  0.5 mg Oral Daily  . heparin      . lisinopril  2.5 mg Oral BID  . metoprolol tartrate  12.5 mg Oral BID  . multivitamin with minerals  1 tablet Oral Daily  . prasugrel  10 mg Oral Daily  . Tamsulosin HCl  0.4 mg Oral Daily   Assessment/Plan: Patient Active Problem List  Diagnosis  . STEMI (ST elevation myocardial infarction), ST elevation in inf. wall, s/p DES resolute to RCA 11/21/11  . BPH (benign prostatic hyperplasia)  . CAD (coronary artery disease), Residual disease of LAD and LCX   PLAN: Soft BP with lisinipril at 2.5 mg BID, Lopressor 12.5 mg BID with HR now in the 50's. On Lipitor. WBC decreasing, with normal u/a, and normal CXR.  Planned staged PCI/Stent LAD/Diag +/- LCX Monday   Will ask cardiac rehab to see.  ? Transfer to tele.  LOS: 1 day   INGOLD,LAURA R 11/22/2011, 7:45 AM  I have seen and examined the patient along with [facility] R, NP.  I have reviewed the chart, notes and new data.  I agree with NP's note.  Key new complaints: feels "great" Key examination changes: no cath access site problems, no overt signs  of CHF; BP and HR relatively low Key new findings / data:cTnI and MB elevation are considerable, even allowing for "quick washout" after PCI, but minimal wall motion abnormality on LV angio. ECG with small inferior Q waves and evolving ST changes.  PLAN: Monitor  On tele floor. Staged PCI on Monday.  Thurmon Fair, MD, [facility] [phone] 11/22/2011, 9:16 AM

## 2011-11-22 NOTE — Progress Notes (Signed)
CARDIAC REHAB PHASE I   PRE:  Rate/Rhythm: 63  SR first degree  BP:  Supine:   Sitting: 97/55  Standing:    SaO2: RA 94  MODE:  Ambulation: 700 ft   POST:  Rate/Rhythem: low  60's during ambulation, 58 at the completion  BP:  Supine:   Sitting: 102/55  Standing:    SaO2: RA 96 Blake Evans   Pt tolerated ambulation well with no complaints of cp or sob.  Pt instructed to ambulate with family again later today. Pt and  family verbalized understanding. Pt to chair, family at bedside, call bell in place.  Pt with no complaints.  Pt asked about the outpatient cardiac rehab program, information and brochure given.  Pt requested name and contact information to be given to outpatient cardiac rehab program at [facility].

## 2011-11-23 LAB — URINALYSIS, ROUTINE W REFLEX MICROSCOPIC
Bilirubin Urine: NEGATIVE
Glucose, UA: NEGATIVE mg/dL
Ketones, ur: NEGATIVE mg/dL
Leukocytes, UA: NEGATIVE
Nitrite: NEGATIVE
Protein, ur: NEGATIVE mg/dL
Specific Gravity, Urine: 1.022 (ref 1.005–1.030)
Urobilinogen, UA: 1 mg/dL (ref 0.0–1.0)
pH: 5 (ref 5.0–8.0)

## 2011-11-23 LAB — URINE MICROSCOPIC-ADD ON

## 2011-11-23 MED ORDER — SODIUM CHLORIDE 0.9 % IV SOLN
1.0000 mL/kg/h | INTRAVENOUS | Status: DC
  Administered 2011-11-24: 1 mL/kg/h via INTRAVENOUS

## 2011-11-23 MED ORDER — SODIUM CHLORIDE 0.9 % IV SOLN: 250.0000 mL | INTRAVENOUS | Status: DC | PRN

## 2011-11-23 MED ORDER — ASPIRIN 81 MG PO CHEW
324.0000 mg | CHEWABLE_TABLET | ORAL | Status: AC
  Administered 2011-11-24: 324 mg via ORAL
  Filled 2011-11-23: qty 4

## 2011-11-23 MED ORDER — SODIUM CHLORIDE 0.9 % IJ SOLN: 3.0000 mL | INTRAMUSCULAR | Status: DC | PRN

## 2011-11-23 MED ORDER — DIAZEPAM 5 MG PO TABS
5.0000 mg | ORAL_TABLET | ORAL | Status: AC
  Administered 2011-11-24: 5 mg via ORAL
  Filled 2011-11-23: qty 1

## 2011-11-23 MED ORDER — SODIUM CHLORIDE 0.9 % IJ SOLN
3.0000 mL | Freq: Two times a day (BID) | INTRAMUSCULAR | Status: DC
  Administered 2011-11-23 – 2011-11-24 (×2): 3 mL via INTRAVENOUS

## 2011-11-23 NOTE — Progress Notes (Signed)
Subjective: No chest pain, no  Complaints.  His daughter is coming in from [place] today.  Objective: Vital signs in last 24 hours: Temp:  [98.2 F (36.8 C)-99.6 F (37.6 C)] 99 F (37.2 C) (06/16 0700) Pulse Rate:  [62-71] 67  (06/16 0700) Resp:  [18] 18  (06/16 0700) BP: (91-112)/(55-69) 103/65 mmHg (06/16 0700) SpO2:  [94 %-99 %] 94 % (06/16 0700) Weight:  [77.8 kg (171 lb 8.3 oz)] 77.8 kg (171 lb 8.3 oz) (06/16 0700) Weight change: 0.1 kg (3.5 oz) Last BM Date: 11/22/11 Intake/Output from previous day: +1120 06/15 0701 - 06/16 0700 In: 610 [P.O.:610] Out: 450 [Urine:450] Intake/Output this shift:    PE: General:alert and oriented, pleasant affect Heart:S1S2 RRR ? Soft systolic murmur, no gallup or rub Lungs:clear without rales, rhonchi or wheezes Abd:+ BS, soft, non tender Ext:no edema.    Lab Results:  Basename 11/22/11 0540 11/21/11 0232  WBC 12.1* 14.1*  HGB 14.6 14.4  HCT 41.8 40.4  PLT 173 181   BMET  Basename 11/22/11 0540 11/21/11 0232  NA 135 134*  K 4.5 4.2  CL 100 100  CO2 25 21  GLUCOSE 103* 129*  BUN 13 13  CREATININE 0.81 0.67  CALCIUM 8.9 8.4    Basename 11/22/11 0825 11/21/11 1830  TROPONINI >20.00* >25.00*    Lab Results  Component Value Date   CHOL 193 11/21/2011   HDL 45 11/21/2011   LDLCALC 116* 11/21/2011   TRIG 158* 11/21/2011   CHOLHDL 4.3 11/21/2011   Lab Results  Component Value Date   HGBA1C 5.3 11/21/2011     Lab Results  Component Value Date   TSH 1.556 11/21/2011    Hepatic Function Panel  Basename 11/21/11 0232  PROT 6.3  ALBUMIN 3.7  AST 87*  ALT 32  ALKPHOS 50  BILITOT 1.0  BILIDIR --  IBILI --    Basename 11/21/11 0234  CHOL 193   No results found for this basename: PROTIME in the last 72 hours   Studies/Results: No results found.  Medications: I have reviewed the patient's current medications.    Marland Kitchen aspirin      . aspirin EC  81 mg Oral Daily  . atorvastatin  40 mg Oral q1800  .  dutasteride  0.5 mg Oral Daily  . lisinopril  2.5 mg Oral BID  . metoprolol tartrate  12.5 mg Oral BID  . multivitamin with minerals  1 tablet Oral Daily  . prasugrel  10 mg Oral Daily  . Tamsulosin HCl  0.4 mg Oral Daily   Assessment/Plan: Patient Active Problem List  Diagnosis  . STEMI (ST elevation myocardial infarction), ST elevation in inf. wall, s/p DES resolute to RCA 11/21/11  . BPH (benign prostatic hyperplasia)  . CAD (coronary artery disease), Residual disease of LAD and LCX   PLAN: BP still lower limits of normal, lisinopril and Lopressor held yest AM. For PCI tomorrow.     LOS: 2 days   INGOLD,LAURA R 11/23/2011, 7:43 AM  I have seen and examined the patient along with [facility] R, NP.  I have reviewed the chart, notes and new data.  I agree with NP's note.  Key new complaints: mild soreness right groin Key examination changes: no hematoma/bruit Key new findings / data: CKMB rapidly trending down  PLAN: Discussed risks and benefits of planned PCI tomorrow. He wishes to proceed.  Thurmon Fair, MD, [facility] [phone] 11/23/2011, 9:42 AM

## 2011-11-24 ENCOUNTER — Encounter (HOSPITAL_COMMUNITY)
Admission: EM | Disposition: A | Discharge status: Home / Self Care | Payer: Self-pay | Source: Ambulatory Visit | Attending: Cardiovascular Disease

## 2011-11-24 HISTORY — PX: PERCUTANEOUS CORONARY STENT INTERVENTION (PCI-S): SHX5485

## 2011-11-24 HISTORY — PX: CORONARY ANGIOPLASTY WITH STENT PLACEMENT: SHX49

## 2011-11-24 LAB — CBC
HCT: 38.4 % — ABNORMAL LOW (ref 39.0–52.0)
Hemoglobin: 13.6 g/dL (ref 13.0–17.0)
MCH: 30.8 pg (ref 26.0–34.0)
MCHC: 35.4 g/dL (ref 30.0–36.0)
MCV: 87.1 fL (ref 78.0–100.0)
Platelets: 176 10*3/uL (ref 150–400)
RBC: 4.41 MIL/uL (ref 4.22–5.81)
RDW: 12.6 % (ref 11.5–15.5)
WBC: 10.1 10*3/uL (ref 4.0–10.5)

## 2011-11-24 LAB — BASIC METABOLIC PANEL
BUN: 17 mg/dL (ref 6–23)
CO2: 23 mEq/L (ref 19–32)
Calcium: 8.8 mg/dL (ref 8.4–10.5)
Chloride: 97 mEq/L (ref 96–112)
Creatinine, Ser: 0.76 mg/dL (ref 0.50–1.35)
GFR calc Af Amer: >90 mL/min (ref >90)
GFR calc non Af Amer: >90 mL/min (ref >90)
Glucose, Bld: 102 mg/dL — ABNORMAL HIGH (ref 70–99)
Potassium: 4.1 mEq/L (ref 3.5–5.1)
Sodium: 132 mEq/L — ABNORMAL LOW (ref 135–145)

## 2011-11-24 LAB — PROTIME-INR
INR: 1.05 (ref 0.00–1.49)
Prothrombin Time: 13.9 seconds (ref 11.6–15.2)

## 2011-11-24 LAB — PLATELET INHIBITION P2Y12: Platelet Function  P2Y12: 7 [PRU] — ABNORMAL LOW (ref 194–418)

## 2011-11-24 LAB — POCT ACTIVATED CLOTTING TIME: Activated Clotting Time: 324 seconds

## 2011-11-24 SURGERY — PERCUTANEOUS CORONARY STENT INTERVENTION (PCI-S)
Anesthesia: LOCAL

## 2011-11-24 MED ORDER — MORPHINE SULFATE 2 MG/ML IJ SOLN
1.0000 mg | INTRAMUSCULAR | Status: DC | PRN
  Administered 2011-11-24: 1 mg via INTRAVENOUS
  Filled 2011-11-24: qty 1

## 2011-11-24 MED ORDER — LIDOCAINE HCL (PF) 1 % IJ SOLN
INTRAMUSCULAR | Status: AC
  Filled 2011-11-24: qty 30

## 2011-11-24 MED ORDER — ACETAMINOPHEN 325 MG PO TABS: 650.0000 mg | ORAL_TABLET | ORAL | Status: DC | PRN

## 2011-11-24 MED ORDER — BIVALIRUDIN 250 MG IV SOLR
INTRAVENOUS | Status: AC
  Filled 2011-11-24: qty 250

## 2011-11-24 MED ORDER — HEPARIN (PORCINE) IN NACL 2-0.9 UNIT/ML-% IJ SOLN
INTRAMUSCULAR | Status: AC
  Filled 2011-11-24: qty 2000

## 2011-11-24 MED ORDER — SODIUM CHLORIDE 0.9 % IV SOLN
INTRAVENOUS | Status: AC
  Administered 2011-11-24 (×3): via INTRAVENOUS

## 2011-11-24 MED ORDER — ASPIRIN EC 325 MG PO TBEC
325.0000 mg | DELAYED_RELEASE_TABLET | Freq: Every day | ORAL | Status: DC
  Administered 2011-11-25: 325 mg via ORAL
  Filled 2011-11-24 (×2): qty 1

## 2011-11-24 MED ORDER — NITROGLYCERIN 0.2 MG/ML ON CALL CATH LAB
INTRAVENOUS | Status: AC
  Filled 2011-11-24: qty 1

## 2011-11-24 MED ORDER — PRASUGREL HCL 10 MG PO TABS: 10.0000 mg | ORAL_TABLET | Freq: Every day | ORAL | Status: DC

## 2011-11-24 MED ORDER — ONDANSETRON HCL 4 MG/2ML IJ SOLN: 4.0000 mg | Freq: Four times a day (QID) | INTRAMUSCULAR | Status: DC | PRN

## 2011-11-24 MED FILL — Dextrose Inj 5%: INTRAVENOUS | Qty: 50 | Status: AC

## 2011-11-24 NOTE — Progress Notes (Signed)
[id number] Have observed pt walking in hall this am.   Education completed. Referring to [facility] Phase 2.  Will see pt in am. Pt states tolerated walks well independently.Sueellen Kayes DunlapRN

## 2011-11-24 NOTE — H&P (Signed)
    Pt was reexamined and existing H & P reviewed. No changes found.  [name], MD [facility] 11/24/2011 2:40 PM

## 2011-11-24 NOTE — Progress Notes (Signed)
Subjective:  No CP / SOB  Objective:  Temp:  [98.4 F (36.9 C)-98.7 F (37.1 C)] 98.5 F (36.9 C) (06/17 0510) Pulse Rate:  [61-67] 65  (06/17 0510) Resp:  [17-19] 17  (06/17 0510) BP: (103-113)/(61-73) 104/62 mmHg (06/17 0510) SpO2:  [95 %-100 %] 96 % (06/17 0510) Weight:  [77.7 kg (171 lb 4.8 oz)] 77.7 kg (171 lb 4.8 oz) (06/17 0510) Weight change: -0.1 kg (-3.5 oz)  Intake/Output from previous day: 06/16 0701 - 06/17 0700 In: 480 [P.O.:480] Out: -   Intake/Output from this shift:    Physical Exam: General appearance: alert, cooperative and appears stated age Neck: no adenopathy, no carotid bruit, no JVD, supple, symmetrical, trachea midline and thyroid not enlarged, symmetric, no tenderness/mass/nodules Lungs: clear to auscultation bilaterally Heart: regular rate and rhythm, S1, S2 normal, no murmur, click, rub or gallop Extremities: mild ecchymosis right groin  Lab Results: Results for orders placed during the hospital encounter of 11/21/11 (from the past 48 hour(s))  URINALYSIS, ROUTINE W REFLEX MICROSCOPIC     Status: Abnormal   Collection Time   11/23/11  6:19 PM      Component Value Range Comment   Color, Urine YELLOW  YELLOW    APPearance CLEAR  CLEAR    Specific Gravity, Urine 1.022  1.005 - 1.030    pH 5.0  5.0 - 8.0    Glucose, UA NEGATIVE  NEGATIVE mg/dL    Hgb urine dipstick TRACE (*) NEGATIVE    Bilirubin Urine NEGATIVE  NEGATIVE    Ketones, ur NEGATIVE  NEGATIVE mg/dL    Protein, ur NEGATIVE  NEGATIVE mg/dL    Urobilinogen, UA 1.0  0.0 - 1.0 mg/dL    Nitrite NEGATIVE  NEGATIVE    Leukocytes, UA NEGATIVE  NEGATIVE   URINE MICROSCOPIC-ADD ON     Status: Abnormal   Collection Time   11/23/11  6:19 PM      Component Value Range Comment   Squamous Epithelial / LPF FEW (*) RARE    WBC, UA 0-2  <3 WBC/hpf    Urine-Other AMORPHOUS URATES/PHOSPHATES     BASIC METABOLIC PANEL     Status: Abnormal   Collection Time   11/24/11  5:00 AM      Component Value  Range Comment   Sodium 132 (*) 135 - 145 mEq/L    Potassium 4.1  3.5 - 5.1 mEq/L    Chloride 97  96 - 112 mEq/L    CO2 23  19 - 32 mEq/L    Glucose, Bld 102 (*) 70 - 99 mg/dL    BUN 17  6 - 23 mg/dL    Creatinine, Ser 1.61  0.50 - 1.35 mg/dL    Calcium 8.8  8.4 - 09.6 mg/dL    GFR calc non Af Amer >90  >90 mL/min    GFR calc Af Amer >90  >90 mL/min   PROTIME-INR     Status: Normal   Collection Time   11/24/11  5:00 AM      Component Value Range Comment   Prothrombin Time 13.9  11.6 - 15.2 seconds    INR 1.05  0.00 - 1.49   CBC     Status: Abnormal   Collection Time   11/24/11  5:00 AM      Component Value Range Comment   WBC 10.1  4.0 - 10.5 K/uL    RBC 4.41  4.22 - 5.81 MIL/uL    Hemoglobin 13.6  13.0 - 17.0  g/dL    HCT 16.1 (*) 09.6 - 52.0 %    MCV 87.1  78.0 - 100.0 fL    MCH 30.8  26.0 - 34.0 pg    MCHC 35.4  30.0 - 36.0 g/dL    RDW 04.5  40.9 - 81.1 %    Platelets 176  150 - 400 K/uL   PLATELET INHIBITION P2Y12     Status: Abnormal   Collection Time   11/24/11  5:00 AM      Component Value Range Comment   Platelet Function  P2Y12 7 (*) 194 - 418 PRU     Imaging: Imaging results have been reviewed  Assessment/Plan:   1. Principal Problem: 2.  *STEMI (ST elevation myocardial infarction), ST elevation in inf. wall, s/p DES resolute to RCA 11/21/11 3. Active Problems: 4.  BPH (benign prostatic hyperplasia) 5.  CAD (coronary artery disease), Residual disease of LAD and LCX 6.   Time Spent Directly with Patient:  20 minutes  Length of Stay:  LOS: 3 days   S/P STEMI/PCI and Stent dominant RCA last week. No CP over the weekend. VSS. Labs OK. On ASA and effient. For stages PCI/Stent LAD/Diag +/- LCX today.  [name] 11/24/2011, 9:48 AM

## 2011-11-24 NOTE — Progress Notes (Signed)
Site area: right groin  Site Prior to Removal:  Level 0  Pressure Applied For 20 MINUTES    Minutes Beginning at 20  Manual:   yes  Patient Status During Pull:  stable  Post Pull Groin Site:  Level 0  Post Pull Instructions Given:  yes  Post Pull Pulses Present:  yes  Dressing Applied:  yes  Comments:   

## 2011-11-24 NOTE — Op Note (Signed)
Blake Evans is a 61 y.o. male    [id number] LOCATION:  FACILITY: [facility]  PHYSICIAN: Nanetta Batty, M.D. January 26, 1951   DATE OF PROCEDURE:  11/24/2011  DATE OF DISCHARGE:  [facility]  CARDIAC CATHETERIZATION     History obtained from chart review. Blake Evans is a 61 year old demented appearing Caucasian male with positive crepitus factors presented with a ST segment elevation myocardial infarction last week secondary to an occluded dominant RCA. He was stented with a resolute drug-eluting stent with a portable and time of 33 minutes. He had preserved left ventricular function. He did have residual LAD diagonal and AV groove circumflex disease. He presents now for staged intervention of this diagonal branch and circumflex. His distal LAD after his second Ditropan fills via right-to-left collaterals.   PROCEDURE DESCRIPTION:    The patient was brought to the second floor  [place] [facility]iac cath lab in the postabsorptive state. He was not  premedicated . His right groin was prepped and shaved in usual sterile fashion. Xylocaine 1% was used  for local anesthesia. A 6 French sheath was inserted into the right common femoral  artery using standard Seldinger technique.a 5 [place] JR 4 catheters used for selective coronary or coronary artery and angiography of the right coronary artery. Visipaque just for the entirety of the case. Retrograde aortic pressure was monitored in the case.   HEMODYNAMICS:    AO SYSTOLIC/AO DIASTOLIC: 112/64    ANGIOGRAPHIC RESULTS:   1. Left main; 40-50% smooth distal  2. LAD; 50% mid followed by total occlusion of the LAD after the second diagonal branch. The second branch had a 99% stenosis at its ostium of the LAD. 3. Left circumflex; 80% segmental mid.  4. Right coronary artery; dominant with a widely patent stent at the CRUX of the vessel with grade 3 right to left collaterals supplying the apical LAD  IMPRESSION:Blake Evans is  RCA stent is widely patent. We'll proceed with PCI stenting of his mid LAD into the second diagonal branch followed by his AV groove circumflex with resolute drug-eluting stents.  Procedure description:the patient was already on aspirin and Effient. Using a 6 [place] XB LAD 3.5 cm guide catheter with sideholes along with an 014 190 cm length Asahi prowater angioplasty wire, and a 2 mm x 12 mm long EMerge balloon predilatation was performed on the LAD into the second diagonal branch. Following the stenting was performed with a 2.25 mm x 12 mm long resolute drug-eluting stent deployed at 14 atmospheres (2.4 mm). 200 mcg of intrcoronary nitroglycerin was administered. The final angiographic resul twas production of a 99% stenosis to 0% residual with excellent flow.  The wire was then redirected down the circumflex. Predilatation was performed in the mid AV groove circumflex with the prior balloon at nominal pressures. Stenting was performed with a 2.5 mm x 12 mm long resolute drug-eluting stent at 2 atmospheres resulting in reduction of 80% mid AV groove circumflex stenosis to 0% residual with excellent flow. The patient tolerated the procedure well. The guidewire and catheter were removed and the sheath was then securely in place.  Final impression: Successful distal LAD diagonal branch PCI and stenting using a resolute drug-eluting stent, along with mid AV groove circumflex PCI and stenting using a resolute drug-eluting stent. His RCA stent placed late last week in the setting of a "STE MI" was widely patent. The patient tolerated the procedure well. He left the lab in stable condition. He'll be hydrated overnight,  and discharged home in the morning.    [name] MD, [facility] 11/24/2011 3:41 PM

## 2011-11-25 ENCOUNTER — Encounter (HOSPITAL_COMMUNITY): Payer: Self-pay | Admitting: Cardiology

## 2011-11-25 DIAGNOSIS — E785 Hyperlipidemia, unspecified: Secondary | ICD-10-CM | POA: Diagnosis present

## 2011-11-25 DIAGNOSIS — Z95820 Peripheral vascular angioplasty status with implants and grafts: Secondary | ICD-10-CM

## 2011-11-25 HISTORY — DX: Peripheral vascular angioplasty status with implants and grafts: Z95.820

## 2011-11-25 HISTORY — DX: Hyperlipidemia, unspecified: E78.5

## 2011-11-25 LAB — BASIC METABOLIC PANEL
BUN: 17 mg/dL (ref 6–23)
CO2: 26 mEq/L (ref 19–32)
Calcium: 9.1 mg/dL (ref 8.4–10.5)
Chloride: 99 mEq/L (ref 96–112)
Creatinine, Ser: 0.89 mg/dL (ref 0.50–1.35)
GFR calc Af Amer: >90 mL/min (ref >90)
GFR calc non Af Amer: >90 mL/min (ref >90)
Glucose, Bld: 101 mg/dL — ABNORMAL HIGH (ref 70–99)
Potassium: 4.6 mEq/L (ref 3.5–5.1)
Sodium: 137 mEq/L (ref 135–145)

## 2011-11-25 LAB — CBC
HCT: 40.3 % (ref 39.0–52.0)
Hemoglobin: 14.1 g/dL (ref 13.0–17.0)
MCH: 30.9 pg (ref 26.0–34.0)
MCHC: 35 g/dL (ref 30.0–36.0)
MCV: 88.2 fL (ref 78.0–100.0)
Platelets: 187 10*3/uL (ref 150–400)
RBC: 4.57 MIL/uL (ref 4.22–5.81)
RDW: 12.8 % (ref 11.5–15.5)
WBC: 9.5 10*3/uL (ref 4.0–10.5)

## 2011-11-25 MED ORDER — ASPIRIN 81 MG PO TBEC: 81.0000 mg | DELAYED_RELEASE_TABLET | Freq: Every day | ORAL | Status: DC

## 2011-11-25 MED ORDER — NITROGLYCERIN 0.4 MG SL SUBL: 0.4000 mg | SUBLINGUAL_TABLET | SUBLINGUAL | Status: DC | PRN

## 2011-11-25 MED ORDER — METOPROLOL SUCCINATE ER 25 MG PO TB24: 12.5000 mg | ORAL_TABLET | Freq: Every day | ORAL | Status: DC

## 2011-11-25 MED ORDER — ASPIRIN 81 MG PO TBEC: 162.0000 mg | DELAYED_RELEASE_TABLET | Freq: Every day | ORAL | Status: AC

## 2011-11-25 MED ORDER — LISINOPRIL 2.5 MG PO TABS: 2.5000 mg | ORAL_TABLET | Freq: Two times a day (BID) | ORAL | Status: DC

## 2011-11-25 MED ORDER — FAMOTIDINE 20 MG PO TABS: 20.0000 mg | ORAL_TABLET | Freq: Every day | ORAL | Status: DC

## 2011-11-25 MED ORDER — ACETAMINOPHEN 325 MG PO TABS: 650.0000 mg | ORAL_TABLET | ORAL | Status: AC | PRN

## 2011-11-25 MED ORDER — ATORVASTATIN CALCIUM 40 MG PO TABS: 40.0000 mg | ORAL_TABLET | Freq: Every day | ORAL | Status: DC

## 2011-11-25 MED ORDER — PRASUGREL HCL 10 MG PO TABS: 10.0000 mg | ORAL_TABLET | Freq: Every day | ORAL | Status: DC

## 2011-11-25 MED FILL — Dextrose Inj 5%: INTRAVENOUS | Qty: 50 | Status: AC

## 2011-11-25 NOTE — Discharge Instructions (Signed)
Call [facility] if any bleeding, swelling or drainage at cath site.  May shower, no tub baths for 48 hours for groin sticks.   Take 1 NTG, under your tongue, while sitting.  If no relief of pain may repeat NTG, one tab every 5 minutes up to 3 tablets total over 15 minutes.  If no relief CALL 911.  If you have dizziness/lightheadness  while taking NTG, stop taking and call 911.        Heart Healthy Diet.  No work until 12/08/11.  No driving for 1 week. No lifting over 10 pounds until seen by Dr. Allyson Sabal.  Activity as per cardiac rehab      .Acute Coronary Syndrome Acute coronary syndrome (ACS) is an urgent problem in which the blood and oxygen supply to the heart is critically deficient. ACS requires hospitalization because one or more coronary arteries may be blocked. ACS represents a range of conditions including:  Previous angina that is now unstable, lasts longer, happens at rest, or is more intense.   A heart attack, with heart muscle cell injury and death.  There are three vital coronary arteries that supply the heart muscle with blood and oxygen so that it can pump blood effectively. If blockages to these arteries develop, blood flow to the heart muscle is reduced. If the heart does not get enough blood, angina may occur as the first warning sign. SYMPTOMS   The most common signs of angina include:   Tightness or squeezing in the chest.   Feeling of heaviness on the chest.   Discomfort in the arms, neck, or jaw.   Shortness of breath and nausea.   Cold, wet skin.   Angina is usually brought on by physical effort or excitement which increase the oxygen needs of the heart. These states increase the blood flow needs of the heart beyond what can be delivered.  TREATMENT   Medicines to help discomfort may include nitroglycerin (nitro) in the form of tablets or a spray for rapid relief, or longer-acting forms such as cream, patches, or capsules. (Be  aware that there are many side effects and possible interactions with other drugs).   Other medicines may be used to help the heart pump better.   Procedures to open blocked arteries including angioplasty or stent placement to keep the arteries open.   Open heart surgery may be needed when there are many blockages or they are in critical locations that are best treated with surgery.  HOME CARE INSTRUCTIONS   Avoid smoking.   Take one baby or adult aspirin daily, if your caregiver advises. This helps reduce the risk of a heart attack.   It is very important that you follow the angina treatment prescribed by your caregiver. Make arrangements for proper follow-up care.   Eat a heart healthy diet with salt and fat restrictions as advised.   Regular exercise is good for you as long as it does not cause discomfort. Do not begin any new type of exercise until you check with your caregiver.   If you are overweight, you should lose weight.   Try to maintain normal blood lipid levels.   Keep your blood pressure under control as recommended by your caregiver.   You should tell your caregiver right away about any increase in the severity or frequency of your chest discomfort or angina attacks. When you have angina, you should stop what you are doing and sit down. This may bring relief  in 3 to 5 minutes. If your caregiver has prescribed nitro, take it as directed.   If your caregiver has given you a follow-up appointment, it is very important to keep that appointment. Not keeping the appointment could result in a chronic or permanent injury, pain, and disability. If there is any problem keeping the appointment, you must call back to this facility for assistance.  SEEK IMMEDIATE MEDICAL CARE IF:   You develop nausea, vomiting, or shortness of breath.   You feel faint, lightheaded, or pass out.   Your chest discomfort gets worse.   You are sweating or experience sudden profound fatigue.   You  do not get relief of your chest pain after 3 doses of nitro.   Your discomfort lasts longer than 15 minutes.  MAKE SURE YOU:   Understand these instructions.   Will watch your condition.   Will get help right away if you are not doing well or get worse.  Document Released: 05/26/2005 Document Revised: 05/15/2011 Document Reviewed: 12/28/2007 [facility] Patient Information 2012 [place], [place].  Call if recurrent chest pain, or any questions.  Always take your daily asprin before taking any naproxysCoronary Angiography with Stent This is a procedure to widen or open a narrow blood vessel of the heart (coronary artery). When a coronary artery becomes partially blocked it decreases blood flow to that area. This may lead to chest pain or a heart attack (myocardial infarction). Arteries may become blocked by cholesterol buildup (plaque) in the lining or wall. A stent is a small piece of metal that looks like a mesh or a spring. Stent placement may be done right after an angiogram that finds a blocked artery or as a treatment for a heart attack. RISKS AND COMPLICATIONS  Damage to the heart.   A blockage may return.   Bleeding at the site.   Blood clot to another part of the body.  PROCEDURE  You may be given a medication to help you relax before and during the procedure through an IV in your hand or arm.   A local anesthetic to make the area numb may be used before inserting the catheter (a long, hollow tube about the size of a piece of cooked spaghetti).   You will be prepared for the procedure by washing and shaving the area where the catheter will be inserted. This is usually done in the groin.   A specially trained doctor will insert the catheter with a guide wire into an artery. This is guided under a special type of X-ray (fluoroscopy) to the opening of the blocked artery.   Special dye is then injected and X-rays are taken.   A tiny wire is guided to the blocked spot and a  balloon is inflated to make the artery wider. The stent is expanded and crushes the plaque into the wall of the vessel. The stent holds the area open like a scaffolding and improves the blood flow.   Sometimes the artery may be made wider using a laser or other tools to remove plaque.   When the blood flow is better, the catheter is removed. The lining of the artery will grow over the stent which stays where it was placed.  AFTER THE PROCEDURE  You will stay in bed for several hours.   The access site will be watched and you will be checked frequently.   Blood tests, X-rays and an EKG may be done.   You may stay in the hospital overnight for  observation.  SEEK IMMEDIATE MEDICAL CARE IF:   You develop chest pain, shortness of breath, feel faint, or pass out.   There is bleeding, swelling, or drainage from the catheter insertion site.   You develop pain, discoloration, coldness, or severe bruising in the leg or arm that held the catheter.   You see blood in your urine or stool. This may be bright red blood in urine or stools, or also appear as black, tarry stools.   You have a fever.  Document Released: 11/30/2002 Document Revised: 05/15/2011 Document Reviewed: 07/23/2007 [facility] Patient Information 2012 [place], [place].Groin Site Care Refer to this sheet in the next few weeks. These instructions provide you with information on caring for yourself after your procedure. Your caregiver may also give you more specific instructions. Your treatment has been planned according to current medical practices, but problems sometimes occur. Call your caregiver if you have any problems or questions after your procedure. HOME CARE INSTRUCTIONS  You may shower 24 hours after the procedure. Remove the bandage (dressing) and gently wash the site with plain soap and water. Gently pat the site dry.   Do not apply powder or lotion to the site.   Do not sit in a bathtub, swimming pool, or whirlpool for 5  to 7 days.   No bending, squatting, or lifting anything over 10 pounds (4.5 kg) as directed by your caregiver.   Inspect the site at least twice daily.   Do not drive home if you are discharged the same day of the procedure. Have someone else drive you.   You may drive 24 hours after the procedure unless otherwise instructed by your caregiver.  What to expect:  Any bruising will usually fade within 1 to 2 weeks.   Blood that collects in the tissue (hematoma) may be painful to the touch. It should usually decrease in size and tenderness within 1 to 2 weeks.  SEEK IMMEDIATE MEDICAL CARE IF:  You have unusual pain at the groin site or down the affected leg.   You have redness, warmth, swelling, or pain at the groin site.   You have drainage (other than a small amount of blood on the dressing).   You have chills.   You have a fever or persistent symptoms for more than 72 hours.   You have a fever and your symptoms suddenly get worse.   Your leg becomes pale, cool, tingly, or numb.   You have heavy bleeding from the site. Hold pressure on the site.  Document Released: 06/28/2010 Document Revised: 05/15/2011 Document Reviewed: 06/28/2010 [employer] Patient Information 2012 Exit[employer], LLCAngiogram, Angioplasty, or Stent Placement Care After One of the following procedures was done today. ANGIOGRAM: A catheter was placed through the blood vessel in your groin, contrast was injected into the vessels, and pictures were taken. ANGIOPLASTY: A catheter was placed through the blood vessel in your groin and directed to an area of blocked blood flow. A balloon, and possibly a metal stent were used to open the blockage. If no other blockages are present below this area, your symptoms should improve. If blockages are present below this area, surgery may still be necessary. STENT: A catheter was placed in your groin through which a metal mesh tube was placed in a narrowed part of the artery to  facilitate blood flow. You were given intravenous sedation. These medications are rapidly cleared from your bloodstream. You may feel some discomfort at the insertion site after the local anesthetic wears off. This  discomfort should gradually improve over the next several days.  Only take over-the-counter or prescription medicines for pain, discomfort, or fever as directed by your caregiver.   Complications are very uncommon after this procedure. Go to the nearest emergency department if you develop any of the following symptoms:   Worsening pain.   Bleeding.   Swelling at the puncture site.   Lightheadedness.   Dizziness or fainting.   Fever or chills.   If oozing, bleeding, or a lump appears at the puncture site, apply firm pressure directly to the site steadily for 15 minutes and go to the emergency department.   Keep the skin around the insertion site dry. You may take showers after 24 hours. If the area does get wet, dry the skin completely. Avoid baths until the skin puncture site heals, usually 5 to 7 days.   Development of redness, increased soreness, or swelling may be signs of a skin infection. Contact your physician.   Rest for the remainder of the day and avoid any heavy lifting (more than 10 pounds or 4.5 kg). Do not operate heavy machinery, drive, or make legal decisions for the first 24 hours after the procedure. Have a responsible person drive you home.   You may resume your usual diet after the procedure. Avoid alcoholic beverages for 24 hours after the procedure.  Document Released: 05/26/2005 Document Revised: 05/15/2011 Document Reviewed: 03/25/2006 [facility] Patient Information 2012 [place], [place].Marland Kitchenn

## 2011-11-25 NOTE — Discharge Summary (Signed)
Physician Discharge Summary  Patient ID: THURL BOEN MRN: [id number] DOB/AGE: Nov 14, 1950 61 y.o.  Admit date: 11/21/2011 Discharge date: 11/25/2011  Discharge Diagnoses:  Principal Problem:  *STEMI (ST elevation myocardial infarction), ST elevation in inf. wall, s/p DES resolute to RCA 11/21/11 Active Problems:  S/P angioplasty with stent, to LAD/Diag and AV groove, LCX.  11/24/11  CAD (coronary artery disease), Residual disease of LAD and LCX  Hyperlipidemia  BPH (benign prostatic hyperplasia)   Discharged Condition: good  Procedures:  11/21/2011  Emergent cardiac cath for Inf. Wall STEMI by Dr. Allyson Sabal. 11/21/2011 PTCA and stent appointment with a drug-eluting stent to the RCA by Dr. Allyson Sabal.  11/24/2011 PTCA and stent deployment to the distal LAD diagonal branch along with mid AV groove circumflex PTCA and stent alignment again with drug-eluting stents to the sites.   Hospital Course: 61 year old WM, with no prior cardiac history, does have a history of BPH and family history of CAD, presented to [facility] ER with upper chest pain. Pain started within the hour prior to admit. Associated symptoms include nausea, Diaphoresis, and SOB. In ER St elevation in leads II, III, AVF, and minimal ST elevation V4-6, with recipical changes in I, AVR, AVF. Given 324mg  ASA in ER, IV Heparin and transported emergently to cath lab at [facility].   Cardiac catheterization revealed:  1. Left main; normal  2. LAD; occluded after the second diagonal branch. The first branch of a 80% segmental proximal stenosis and was a small vessel. The second diagonal branch had a 99% ostial stenosis there was a moderate size vessel.  3. Left circumflex; 80-90% mid AV groove stenosis.  4. Right coronary artery; this was the infarct related artery" and was occluded at the "crux "  5.LIMA was subselectively visualized and was widely patent. It was suitable for use during for aortic bypass grafting if necessary.  6. Left  ventriculography; RAO left ventriculogram was performed using  25 mL of Visipaque dye at 12 mL/second. The overall LVEF estimated  50-55 % With wall motion abnormalities notable for mild inferobasal hypokinesia   Patient then underwent PTCA and stent deployment to the RCA with a drug-eluting stent by Dr. Allyson Sabal.  During the procedure he had bradycardia, beta blocker was held the first night.   He had no complaints no further chest pain troponin T peaked at greater than 25. Blood pressure was stable and heart rate was stable he was started on low-dose beta blocker low-dose ACE inhibitor and his statin that was started the night before was continued.  He continued to be stable was transferred to the telemetry bed and was able to ambulate without complications. He was seen by cardiac rehabilitation as well. By 11/24/2011 he was stable and ready to undergo further procedure for his residual coronary artery disease to the LAD and the circumflex.  He tolerated angioplasty and stents drug-eluting to both the LAD diagonal and to the AV groove circumflex.  By the next morning he was stable without complaints and ready for discharge home he was evaluated by Dr. Allyson Sabal and felt to be stable.  He should not work until 12/08/2011.  He'll followup in our office.    Consults: None  Significant Diagnostic Studies:  Peak cardiac enzymes CK 2246 MB 157, troponin > 25, these numbers gradually declined during hospitalization  Initial urinalysis revealed a large amount of hemoglobin but only trace at discharge  TSH 1.56,  Hemoglobin A1c 5.3  Total cholesterol 193, triglycerides 158, HDL 45, LDL  116.  At discharge sodium 137 potassium 4.6 chloride 99 CO2 26 BUN 17 creatinine 0.89 calcium 9.1 glucose 101  Please note LFTs were normal on admission but after admission AST was up to 87.  Hemoglobin 14.1 hematocrit 40.3 WBC 9.5 platelets 187 at discharge  Initial EKG sinus rhythm with ST elevation in 23 and  aVF Post procedure EKG revealed resolution of ST elevation Followup EKGs with evolutionary changes of acute inferior MI with T wave inversions in 2,3 and aVF   Discharge Exam: Blood pressure 106/62, pulse 62, temperature 98.6 F (37 C), temperature source Oral, resp. rate 20, height 5\' 11"  (1.803 m), weight 78.9 kg (173 lb 15.1 oz), SpO2 95.00%.   General appearance: alert, cooperative, appears stated age and no distress  Neck: no adenopathy, no carotid bruit, no JVD, supple, symmetrical, trachea midline and thyroid not enlarged, symmetric, no tenderness/mass/nodules  Lungs: clear to auscultation bilaterally  Heart: regular rate and rhythm, S1, S2 normal, no murmur, click, rub or gallop  Pulses: 2+ and symmetric  right groin OK  Disposition: 01-Home or Self Care  Discharge Orders    Future Orders Please Complete By Expires   Amb Referral to Cardiac Rehabilitation        Medication List  As of 11/25/2011  4:29 PM   TAKE these medications         acetaminophen 325 MG tablet   Commonly known as: TYLENOL   Take 2 tablets (650 mg total) by mouth every 4 (four) hours as needed for pain.      aspirin 81 MG EC tablet   Take 2 tablets (162 mg total) by mouth daily.      atorvastatin 40 MG tablet   Commonly known as: LIPITOR   Take 1 tablet (40 mg total) by mouth daily at 6 PM.      famotidine 20 MG tablet   Commonly known as: PEPCID   Take 1 tablet (20 mg total) by mouth daily.      JALYN 0.5-0.4 MG Caps   Generic drug: Dutasteride-Tamsulosin HCl   Take 1 capsule by mouth daily.      lisinopril 2.5 MG tablet   Commonly known as: PRINIVIL,ZESTRIL   Take 1 tablet (2.5 mg total) by mouth 2 (two) times daily.      metoprolol succinate 25 MG 24 hr tablet   Commonly known as: TOPROL-XL   Take 0.5 tablets (12.5 mg total) by mouth daily.      multivitamin tablet   Take 1 tablet by mouth daily.      naproxen sodium 220 MG tablet   Commonly known as: ANAPROX   Take 220 mg by  mouth 2 (two) times daily as needed. For pain      nitroGLYCERIN 0.4 MG SL tablet   Commonly known as: NITROSTAT   Place 1 tablet (0.4 mg total) under the tongue every 5 (five) minutes x 3 doses as needed for chest pain.      prasugrel 10 MG Tabs   Commonly known as: EFFIENT   Take 1 tablet (10 mg total) by mouth daily.      saw palmetto 80 MG capsule   Take 80 mg by mouth daily.           Follow-up Information    Follow up with [name], MD. (Our office will call)    Contact information:   [address] [place] [address] [phone]  Discharge Instructions:  Call [facility] if any bleeding, swelling or drainage at cath site.  May shower, no tub baths for 48 hours for groin sticks.   Take 1 NTG, under your tongue, while sitting.  If no relief of pain may repeat NTG, one tab every 5 minutes up to 3 tablets total over 15 minutes.  If no relief CALL 911.  If you have dizziness/lightheadness  while taking NTG, stop taking and call 911.        Heart Healthy Diet.  No work until 12/08/11.  No driving for 1 week. No lifting over 10 pounds until seen by Dr. Allyson Sabal.  Activity as per cardiac rehab     Signed: Donnovan Stamour R 11/25/2011, 4:29 PM

## 2011-11-25 NOTE — Progress Notes (Signed)
0900 Pt states has been walking independently after staged procedure without Cp. No new questions. Ed done yesterday.Parthenia Tellefsen DunlapRN

## 2011-11-25 NOTE — Progress Notes (Signed)
Subjective:  No CP/SOB  Objective:  Temp:  [98 F (36.7 C)-98.6 F (37 C)] 98.6 F (37 C) (06/18 0418) Pulse Rate:  [54-68] 57  (06/18 0418) Resp:  [13-20] 20  (06/18 0418) BP: (96-139)/(52-96) 96/62 mmHg (06/18 0418) SpO2:  [94 %-98 %] 95 % (06/18 0418) Weight:  [78.9 kg (173 lb 15.1 oz)] 78.9 kg (173 lb 15.1 oz) (06/18 0024) Weight change: 1.2 kg (2 lb 10.3 oz)  Intake/Output from previous day: 06/17 0701 - 06/18 0700 In: 936.3 [P.O.:720; I.V.:216.3] Out: 850 [Urine:850]  Intake/Output from this shift:    Physical Exam: General appearance: alert, cooperative, appears stated age and no distress Neck: no adenopathy, no carotid bruit, no JVD, supple, symmetrical, trachea midline and thyroid not enlarged, symmetric, no tenderness/mass/nodules Lungs: clear to auscultation bilaterally Heart: regular rate and rhythm, S1, S2 normal, no murmur, click, rub or gallop Pulses: 2+ and symmetric right groin OK  Lab Results: Results for orders placed during the hospital encounter of 11/21/11 (from the past 48 hour(s))  URINALYSIS, ROUTINE W REFLEX MICROSCOPIC     Status: Abnormal   Collection Time   11/23/11  6:19 PM      Component Value Range Comment   Color, Urine YELLOW  YELLOW    APPearance CLEAR  CLEAR    Specific Gravity, Urine 1.022  1.005 - 1.030    pH 5.0  5.0 - 8.0    Glucose, UA NEGATIVE  NEGATIVE mg/dL    Hgb urine dipstick TRACE (*) NEGATIVE    Bilirubin Urine NEGATIVE  NEGATIVE    Ketones, ur NEGATIVE  NEGATIVE mg/dL    Protein, ur NEGATIVE  NEGATIVE mg/dL    Urobilinogen, UA 1.0  0.0 - 1.0 mg/dL    Nitrite NEGATIVE  NEGATIVE    Leukocytes, UA NEGATIVE  NEGATIVE   URINE MICROSCOPIC-ADD ON     Status: Abnormal   Collection Time   11/23/11  6:19 PM      Component Value Range Comment   Squamous Epithelial / LPF FEW (*) RARE    WBC, UA 0-2  <3 WBC/hpf    Urine-Other AMORPHOUS URATES/PHOSPHATES     BASIC METABOLIC PANEL     Status: Abnormal   Collection Time   11/24/11  5:00 AM      Component Value Range Comment   Sodium 132 (*) 135 - 145 mEq/L    Potassium 4.1  3.5 - 5.1 mEq/L    Chloride 97  96 - 112 mEq/L    CO2 23  19 - 32 mEq/L    Glucose, Bld 102 (*) 70 - 99 mg/dL    BUN 17  6 - 23 mg/dL    Creatinine, Ser 1.19  0.50 - 1.35 mg/dL    Calcium 8.8  8.4 - 14.7 mg/dL    GFR calc non Af Amer >90  >90 mL/min    GFR calc Af Amer >90  >90 mL/min   PROTIME-INR     Status: Normal   Collection Time   11/24/11  5:00 AM      Component Value Range Comment   Prothrombin Time 13.9  11.6 - 15.2 seconds    INR 1.05  0.00 - 1.49   CBC     Status: Abnormal   Collection Time   11/24/11  5:00 AM      Component Value Range Comment   WBC 10.1  4.0 - 10.5 K/uL    RBC 4.41  4.22 - 5.81 MIL/uL    Hemoglobin 13.6  13.0 - 17.0 g/dL    HCT 40.9 (*) 81.1 - 52.0 %    MCV 87.1  78.0 - 100.0 fL    MCH 30.8  26.0 - 34.0 pg    MCHC 35.4  30.0 - 36.0 g/dL    RDW 91.4  78.2 - 95.6 %    Platelets 176  150 - 400 K/uL   PLATELET INHIBITION P2Y12     Status: Abnormal   Collection Time   11/24/11  5:00 AM      Component Value Range Comment   Platelet Function  P2Y12 7 (*) 194 - 418 PRU   POCT ACTIVATED CLOTTING TIME     Status: Normal   Collection Time   11/24/11  2:56 PM      Component Value Range Comment   Activated Clotting Time 324     CBC     Status: Normal   Collection Time   11/25/11  4:05 AM      Component Value Range Comment   WBC 9.5  4.0 - 10.5 K/uL    RBC 4.57  4.22 - 5.81 MIL/uL    Hemoglobin 14.1  13.0 - 17.0 g/dL    HCT 21.3  08.6 - 57.8 %    MCV 88.2  78.0 - 100.0 fL    MCH 30.9  26.0 - 34.0 pg    MCHC 35.0  30.0 - 36.0 g/dL    RDW 46.9  62.9 - 52.8 %    Platelets 187  150 - 400 K/uL   BASIC METABOLIC PANEL     Status: Abnormal   Collection Time   11/25/11  4:05 AM      Component Value Range Comment   Sodium 137  135 - 145 mEq/L    Potassium 4.6  3.5 - 5.1 mEq/L    Chloride 99  96 - 112 mEq/L    CO2 26  19 - 32 mEq/L    Glucose, Bld 101  (*) 70 - 99 mg/dL    BUN 17  6 - 23 mg/dL    Creatinine, Ser 4.13  0.50 - 1.35 mg/dL    Calcium 9.1  8.4 - 24.4 mg/dL    GFR calc non Af Amer >90  >90 mL/min    GFR calc Af Amer >90  >90 mL/min     Imaging: Imaging results have been reviewed  Assessment/Plan:   1. Principal Problem: 2.  *STEMI (ST elevation myocardial infarction), ST elevation in inf. wall, s/p DES resolute to RCA 11/21/11 3. Active Problems: 4.  BPH (benign prostatic hyperplasia) 5.  CAD (coronary artery disease), Residual disease of LAD and LCX 6.   Time Spent Directly with Patient:  20 minutes  Length of Stay:  LOS: 4 days   S/P staged PCI / Stent LAD/Diag and AV grove LCX. Looks great!!! No Sx. Exam benign. Labs OK. D/C home on asa and Effient. ROV with me 2-3 weeks.  [name] 11/25/2011, 8:06 AM

## 2011-12-18 ENCOUNTER — Encounter (HOSPITAL_COMMUNITY)
Admission: RE | Admit: 2011-12-18 | Discharge: 2011-12-18 | Disposition: A | Discharge status: Home / Self Care | Payer: 59 | Source: Ambulatory Visit | Attending: Cardiovascular Disease | Admitting: Cardiovascular Disease

## 2011-12-18 ENCOUNTER — Encounter (HOSPITAL_COMMUNITY): Payer: Self-pay

## 2011-12-18 DIAGNOSIS — N400 Enlarged prostate without lower urinary tract symptoms: Secondary | ICD-10-CM | POA: Insufficient documentation

## 2011-12-18 DIAGNOSIS — Z8249 Family history of ischemic heart disease and other diseases of the circulatory system: Secondary | ICD-10-CM | POA: Insufficient documentation

## 2011-12-18 DIAGNOSIS — I2119 ST elevation (STEMI) myocardial infarction involving other coronary artery of inferior wall: Secondary | ICD-10-CM | POA: Insufficient documentation

## 2011-12-18 DIAGNOSIS — E785 Hyperlipidemia, unspecified: Secondary | ICD-10-CM | POA: Insufficient documentation

## 2011-12-18 DIAGNOSIS — Z5189 Encounter for other specified aftercare: Secondary | ICD-10-CM | POA: Insufficient documentation

## 2011-12-18 DIAGNOSIS — I2510 Atherosclerotic heart disease of native coronary artery without angina pectoris: Secondary | ICD-10-CM | POA: Insufficient documentation

## 2011-12-18 DIAGNOSIS — Z9861 Coronary angioplasty status: Secondary | ICD-10-CM | POA: Insufficient documentation

## 2011-12-18 NOTE — Progress Notes (Signed)
Cardiac Rehab Medication Review by a Pharmacist  Does the patient  feel that his/her medications are working for him/her?  yes  Has the patient been experiencing any side effects to the medications prescribed?  yes  Does the patient measure his/her own blood pressure or blood glucose at home?  yes   Does the patient have any problems obtaining medications due to transportation or finances?   yes  Understanding of regimen: good Understanding of indications: good Potential of compliance: good    Pharmacist comments: Pt expressed good understanding of medications. He reports some minor skin irritations and neuropathy that he believes may be associated with the medication, but does not currently feel it is detrimental. He has expressed problems remembering to take the evening doses of his medications. I expressed to him that for the atorvastatin taking it was more important than timing and if he felt taking it in the morning would help that he could.     Vania Rea. Darin Engels.D. Clinical Pharmacist Pager [phone] Phone [phone] 12/18/2011 8:44 AM

## 2011-12-24 ENCOUNTER — Encounter (HOSPITAL_COMMUNITY)
Admission: RE | Admit: 2011-12-24 | Discharge: 2011-12-24 | Disposition: A | Discharge status: Home / Self Care | Payer: 59 | Source: Ambulatory Visit | Attending: Cardiovascular Disease | Admitting: Cardiovascular Disease

## 2011-12-24 NOTE — Progress Notes (Signed)
Pt started cardiac rehab today.  Pt tolerated light exercise without difficulty. Monitor showed SR with ST depression and T wave inversion. Continue to monitor.

## 2011-12-26 ENCOUNTER — Encounter (HOSPITAL_COMMUNITY)
Admission: RE | Admit: 2011-12-26 | Discharge: 2011-12-26 | Disposition: A | Discharge status: Home / Self Care | Payer: 59 | Source: Ambulatory Visit | Attending: Cardiovascular Disease | Admitting: Cardiovascular Disease

## 2011-12-26 NOTE — Progress Notes (Signed)
Blake Evans 61 y.o. male       Nutrition Screen                                                                    YES  NO Do you live in a nursing home?  X   Do you eat out more than 3 times/week?    X If yes, how many times per week do you eat out?   Do you have food allergies?   X If yes, what are you allergic to?  Have you gained or lost more than 10 lbs without trying?               X If yes, how much weight have you lost and over what time period?    Do you want to lose weight?     X If yes, what is a goal weight or amount of weight you would like to lose?    Do you eat alone most of the time?  X    Do you eat less than 2 meals/day?  X If yes, how many meals do you eat?    Do you drink more than 3 alcohol drinks/day?  X If yes, how many drinks per day?   Are you having trouble with constipation? *  X If yes, what are you doing to help relieve constipation?  Do you have financial difficulties with buying food?*    X   Are you experiencing regular nausea/ vomiting?*     X   Do you have a poor appetite? *                                        X   Do you have trouble chewing/swallowing? *   X    Pt with diagnoses of:  X Stent/ PTCA X Dyslipidemia  / HDL< 40 / LDL>70 / High TG      X %  Body fat >goal / Body Mass Index >25 X MI       Pt Risk Score   1       Diagnosis Risk Score  20       Total Risk Score   21                         High Risk               X Low Risk    HT: 71.5" Ht Readings from Last 1 Encounters:  12/18/11 5' 11.5" (1.816 m)    WT:   164.3 lb (74.7 kg) Wt Readings from Last 3 Encounters:  12/18/11 164 lb 10.9 oz (74.7 kg)  11/25/11 173 lb 15.1 oz (78.9 kg)  11/25/11 173 lb 15.1 oz (78.9 kg)     IBW 79.5 94%IBW BMI 22.6 23.7%body fat  Meds reviewed: MVI, Saw Palmetto Past Medical History  Diagnosis Date  . Hypercholesteremia   . STEMI (ST elevation myocardial infarction), ST elevation in inf. wall 11/21/2011  . BPH (benign prostatic hyperplasia)  11/21/2011  . CAD (coronary artery disease), Residual disease of LAD and LCX  11/21/2011  . Kidney stone   . S/P angioplasty with stent, to LAD/Diag and AV groove, LCX.  11/24/11 11/25/2011  . Hyperlipidemia 11/25/2011       Activity level: Pt is active  Wt goal: 164 lb ( 74.7 kg) Current tobacco use? No Food/Drug Interaction? No Labs:  Lipid Panel     Component Value Date/Time   CHOL 193 11/21/2011 0234   TRIG 158* 11/21/2011 0234   HDL 45 11/21/2011 0234   CHOLHDL 4.3 11/21/2011 0234   VLDL 32 11/21/2011 0234   LDLCALC 116* 11/21/2011 0234   Lab Results  Component Value Date   HGBA1C 5.3 11/21/2011   11/25/11 Glucose 101  LDL goal: < 70      MI and > 2:      family h/o, > 62 yo male Estimated Daily Nutrition Needs for: ? wt maintenance 2400-2700 Kcal , Total Fat 75-90gm, Saturated Fat 15-18 gm, Trans Fat 2.3-2.7 gm,  Sodium less than 1500 mg

## 2011-12-31 ENCOUNTER — Encounter (HOSPITAL_COMMUNITY)
Admission: RE | Admit: 2011-12-31 | Discharge: 2011-12-31 | Disposition: A | Discharge status: Home / Self Care | Payer: 59 | Source: Ambulatory Visit | Attending: Cardiovascular Disease | Admitting: Cardiovascular Disease

## 2012-01-02 ENCOUNTER — Encounter (HOSPITAL_COMMUNITY)
Admission: RE | Admit: 2012-01-02 | Discharge: 2012-01-02 | Disposition: A | Discharge status: Home / Self Care | Payer: 59 | Source: Ambulatory Visit | Attending: Cardiovascular Disease | Admitting: Cardiovascular Disease

## 2012-01-07 ENCOUNTER — Encounter (HOSPITAL_COMMUNITY)
Admission: RE | Admit: 2012-01-07 | Discharge: 2012-01-07 | Disposition: A | Discharge status: Home / Self Care | Payer: 59 | Source: Ambulatory Visit | Attending: Cardiovascular Disease | Admitting: Cardiovascular Disease

## 2012-01-09 ENCOUNTER — Encounter (HOSPITAL_COMMUNITY): Payer: 59

## 2012-01-14 ENCOUNTER — Encounter (HOSPITAL_COMMUNITY)
Admission: RE | Admit: 2012-01-14 | Discharge: 2012-01-14 | Disposition: A | Discharge status: Home / Self Care | Payer: 59 | Source: Ambulatory Visit | Attending: Cardiovascular Disease | Admitting: Cardiovascular Disease

## 2012-01-14 DIAGNOSIS — I2510 Atherosclerotic heart disease of native coronary artery without angina pectoris: Secondary | ICD-10-CM | POA: Insufficient documentation

## 2012-01-14 DIAGNOSIS — Z9861 Coronary angioplasty status: Secondary | ICD-10-CM | POA: Insufficient documentation

## 2012-01-14 DIAGNOSIS — N400 Enlarged prostate without lower urinary tract symptoms: Secondary | ICD-10-CM | POA: Insufficient documentation

## 2012-01-14 DIAGNOSIS — Z5189 Encounter for other specified aftercare: Secondary | ICD-10-CM | POA: Insufficient documentation

## 2012-01-14 DIAGNOSIS — I2119 ST elevation (STEMI) myocardial infarction involving other coronary artery of inferior wall: Secondary | ICD-10-CM | POA: Insufficient documentation

## 2012-01-14 DIAGNOSIS — E785 Hyperlipidemia, unspecified: Secondary | ICD-10-CM | POA: Insufficient documentation

## 2012-01-14 DIAGNOSIS — Z8249 Family history of ischemic heart disease and other diseases of the circulatory system: Secondary | ICD-10-CM | POA: Insufficient documentation

## 2012-01-16 ENCOUNTER — Encounter (HOSPITAL_COMMUNITY)
Admission: RE | Admit: 2012-01-16 | Discharge: 2012-01-16 | Disposition: A | Discharge status: Home / Self Care | Payer: 59 | Source: Ambulatory Visit | Attending: Cardiovascular Disease | Admitting: Cardiovascular Disease

## 2012-01-19 ENCOUNTER — Encounter (HOSPITAL_COMMUNITY)
Admission: RE | Admit: 2012-01-19 | Discharge: 2012-01-19 | Disposition: A | Discharge status: Home / Self Care | Payer: 59 | Source: Ambulatory Visit

## 2012-01-21 ENCOUNTER — Encounter (HOSPITAL_COMMUNITY)
Admission: RE | Admit: 2012-01-21 | Discharge: 2012-01-21 | Disposition: A | Discharge status: Home / Self Care | Payer: 59 | Source: Ambulatory Visit | Attending: Cardiovascular Disease | Admitting: Cardiovascular Disease

## 2012-01-23 ENCOUNTER — Encounter (HOSPITAL_COMMUNITY)
Admission: RE | Admit: 2012-01-23 | Discharge: 2012-01-23 | Disposition: A | Discharge status: Home / Self Care | Payer: 59 | Source: Ambulatory Visit | Attending: Cardiovascular Disease | Admitting: Cardiovascular Disease

## 2012-01-28 ENCOUNTER — Encounter (HOSPITAL_COMMUNITY): Payer: 59

## 2012-01-30 ENCOUNTER — Encounter (HOSPITAL_COMMUNITY)
Admission: RE | Admit: 2012-01-30 | Discharge: 2012-01-30 | Disposition: A | Discharge status: Home / Self Care | Payer: 59 | Source: Ambulatory Visit | Attending: Cardiovascular Disease | Admitting: Cardiovascular Disease

## 2012-01-30 NOTE — Progress Notes (Signed)
Clarene Critchley 61 y.o. male Nutrition Note Spoke with pt.  Nutrition Plan, Nutrition Survey, and cholesterol goals reviewed with pt. Pt is following Step 2 of the Therapeutic Lifestyle Changes diet. Pt reports his wt is down 15# from his UBW due to dietary changes. Pt states he has always tried to eat healthy and recently cut down sodium and sugar intake. Per discussion, pt has been losing wt "every day except today because I just got back from seeing my mother and didn't eat as healthy." Increasing kcal intake to prevent further wt loss discussed. Pt encouraged to try eating more nuts, avocado, whole grains, and dried fruit. Pt unable to attend Nutrition I and II classes due to working in [place], [place].  Nutrition Diagnosis   Food-and nutrition-related knowledge deficit related to lack of exposure to information as related to diagnosis of: ? CVD   Nutrition RX/ Estimated Daily Nutrition Needs for: wt maintenance 2400-2700 Kcal, 75-90 gm fat, 15-18 gm sat fat, 2.3-2.7 gm trans-fat, <1500 mg sodium   Nutrition Intervention   Pt's individual nutrition plan including cholesterol goals reviewed with pt.   Benefits of adopting Therapeutic Lifestyle Changes discussed when Medficts reviewed.   Pt to attend the Portion Distortion class - met   Handouts given for Nutrition I class and Nutrition II class   Continue client-centered nutrition education by RD, as part of interdisciplinary care. Goal(s)   Pt to identify food quantities necessary to achieve wt maintenance at graduation from cardiac rehab.  Monitor and Evaluate progress toward nutrition goal with team.

## 2012-02-02 ENCOUNTER — Encounter (HOSPITAL_COMMUNITY)
Admission: RE | Admit: 2012-02-02 | Discharge: 2012-02-02 | Disposition: A | Discharge status: Home / Self Care | Payer: 59 | Source: Ambulatory Visit | Attending: Cardiovascular Disease | Admitting: Cardiovascular Disease

## 2012-02-04 ENCOUNTER — Encounter (HOSPITAL_COMMUNITY)
Admission: RE | Admit: 2012-02-04 | Discharge: 2012-02-04 | Disposition: A | Discharge status: Home / Self Care | Payer: 59 | Source: Ambulatory Visit | Attending: Cardiovascular Disease | Admitting: Cardiovascular Disease

## 2012-02-06 ENCOUNTER — Encounter (HOSPITAL_COMMUNITY)
Admission: RE | Admit: 2012-02-06 | Discharge: 2012-02-06 | Disposition: A | Discharge status: Home / Self Care | Payer: 59 | Source: Ambulatory Visit | Attending: Cardiovascular Disease | Admitting: Cardiovascular Disease

## 2012-02-11 ENCOUNTER — Encounter: Payer: Self-pay | Admitting: Gastroenterology

## 2012-02-11 ENCOUNTER — Encounter (HOSPITAL_COMMUNITY)
Admission: RE | Admit: 2012-02-11 | Discharge: 2012-02-11 | Disposition: A | Discharge status: Home / Self Care | Payer: 59 | Source: Ambulatory Visit | Attending: Cardiovascular Disease | Admitting: Cardiovascular Disease

## 2012-02-11 DIAGNOSIS — I2119 ST elevation (STEMI) myocardial infarction involving other coronary artery of inferior wall: Secondary | ICD-10-CM | POA: Insufficient documentation

## 2012-02-11 DIAGNOSIS — N400 Enlarged prostate without lower urinary tract symptoms: Secondary | ICD-10-CM | POA: Insufficient documentation

## 2012-02-11 DIAGNOSIS — Z9861 Coronary angioplasty status: Secondary | ICD-10-CM | POA: Insufficient documentation

## 2012-02-11 DIAGNOSIS — E785 Hyperlipidemia, unspecified: Secondary | ICD-10-CM | POA: Insufficient documentation

## 2012-02-11 DIAGNOSIS — I2510 Atherosclerotic heart disease of native coronary artery without angina pectoris: Secondary | ICD-10-CM | POA: Insufficient documentation

## 2012-02-11 DIAGNOSIS — Z8249 Family history of ischemic heart disease and other diseases of the circulatory system: Secondary | ICD-10-CM | POA: Insufficient documentation

## 2012-02-11 DIAGNOSIS — Z5189 Encounter for other specified aftercare: Secondary | ICD-10-CM | POA: Insufficient documentation

## 2012-02-13 ENCOUNTER — Encounter (HOSPITAL_COMMUNITY)
Admission: RE | Admit: 2012-02-13 | Discharge: 2012-02-13 | Disposition: A | Discharge status: Home / Self Care | Payer: 59 | Source: Ambulatory Visit | Attending: Cardiovascular Disease | Admitting: Cardiovascular Disease

## 2012-02-16 ENCOUNTER — Encounter (HOSPITAL_COMMUNITY)
Admission: RE | Admit: 2012-02-16 | Discharge: 2012-02-16 | Disposition: A | Discharge status: Home / Self Care | Payer: 59 | Source: Ambulatory Visit | Attending: Cardiovascular Disease | Admitting: Cardiovascular Disease

## 2012-02-18 ENCOUNTER — Encounter (HOSPITAL_COMMUNITY)
Admission: RE | Admit: 2012-02-18 | Discharge: 2012-02-18 | Disposition: A | Discharge status: Home / Self Care | Payer: 59 | Source: Ambulatory Visit | Attending: Cardiovascular Disease | Admitting: Cardiovascular Disease

## 2012-02-20 ENCOUNTER — Encounter (HOSPITAL_COMMUNITY)
Admission: RE | Admit: 2012-02-20 | Discharge: 2012-02-20 | Disposition: A | Discharge status: Home / Self Care | Payer: 59 | Source: Ambulatory Visit | Attending: Cardiovascular Disease | Admitting: Cardiovascular Disease

## 2012-02-23 ENCOUNTER — Encounter (HOSPITAL_COMMUNITY)
Admission: RE | Admit: 2012-02-23 | Discharge: 2012-02-23 | Disposition: A | Discharge status: Home / Self Care | Payer: 59 | Source: Ambulatory Visit | Attending: Cardiovascular Disease | Admitting: Cardiovascular Disease

## 2012-02-25 ENCOUNTER — Encounter (HOSPITAL_COMMUNITY): Payer: 59

## 2012-02-27 ENCOUNTER — Encounter (HOSPITAL_COMMUNITY): Payer: 59

## 2012-03-17 ENCOUNTER — Ambulatory Visit (INDEPENDENT_AMBULATORY_CARE_PROVIDER_SITE_OTHER): Payer: 59 | Admitting: Family Medicine

## 2012-03-17 ENCOUNTER — Encounter: Payer: Self-pay | Admitting: Family Medicine

## 2012-03-17 VITALS — BP 114/70 | HR 72 | Temp 98.6°F | Resp 16 | Ht 71.0 in | Wt 158.0 lb

## 2012-03-17 DIAGNOSIS — Z23 Encounter for immunization: Secondary | ICD-10-CM

## 2012-03-17 DIAGNOSIS — Z0000 Encounter for general adult medical examination without abnormal findings: Secondary | ICD-10-CM

## 2012-03-17 DIAGNOSIS — I219 Acute myocardial infarction, unspecified: Secondary | ICD-10-CM

## 2012-03-17 DIAGNOSIS — H9190 Unspecified hearing loss, unspecified ear: Secondary | ICD-10-CM

## 2012-03-17 DIAGNOSIS — Z9861 Coronary angioplasty status: Secondary | ICD-10-CM

## 2012-03-17 DIAGNOSIS — I213 ST elevation (STEMI) myocardial infarction of unspecified site: Secondary | ICD-10-CM

## 2012-03-17 DIAGNOSIS — N400 Enlarged prostate without lower urinary tract symptoms: Secondary | ICD-10-CM

## 2012-03-17 DIAGNOSIS — I2510 Atherosclerotic heart disease of native coronary artery without angina pectoris: Secondary | ICD-10-CM

## 2012-03-17 DIAGNOSIS — Z95820 Peripheral vascular angioplasty status with implants and grafts: Secondary | ICD-10-CM

## 2012-03-17 DIAGNOSIS — E785 Hyperlipidemia, unspecified: Secondary | ICD-10-CM

## 2012-03-17 LAB — PSA: PSA: 6.34 ng/mL — ABNORMAL HIGH (ref 0.10–4.00)

## 2012-03-17 LAB — POCT URINALYSIS DIPSTICK
Bilirubin, UA: NEGATIVE
Glucose, UA: NEGATIVE
Ketones, UA: NEGATIVE
Leukocytes, UA: NEGATIVE
Nitrite, UA: NEGATIVE
Protein, UA: NEGATIVE
Spec Grav, UA: 1.015
Urobilinogen, UA: 0.2
pH, UA: 6.5

## 2012-03-17 LAB — LIPID PANEL
Cholesterol: 131 mg/dL (ref 0–200)
HDL: 61.8 mg/dL (ref >39.00)
LDL Cholesterol: 57 mg/dL (ref 0–99)
Total CHOL/HDL Ratio: 2
Triglycerides: 62 mg/dL (ref 0.0–149.0)
VLDL: 12.4 mg/dL (ref 0.0–40.0)

## 2012-03-17 LAB — BASIC METABOLIC PANEL
BUN: 14 mg/dL (ref 6–23)
CO2: 29 mEq/L (ref 19–32)
Calcium: 9.4 mg/dL (ref 8.4–10.5)
Chloride: 102 mEq/L (ref 96–112)
Creatinine, Ser: 0.7 mg/dL (ref 0.4–1.5)
GFR: 113.95 mL/min (ref >60.00)
Glucose, Bld: 82 mg/dL (ref 70–99)
Potassium: 4.9 mEq/L (ref 3.5–5.1)
Sodium: 139 mEq/L (ref 135–145)

## 2012-03-17 LAB — HEPATIC FUNCTION PANEL
ALT: 52 U/L (ref 0–53)
AST: 38 U/L — ABNORMAL HIGH (ref 0–37)
Albumin: 4.3 g/dL (ref 3.5–5.2)
Alkaline Phosphatase: 51 U/L (ref 39–117)
Bilirubin, Direct: 0.3 mg/dL (ref 0.0–0.3)
Total Bilirubin: 2.1 mg/dL — ABNORMAL HIGH (ref 0.3–1.2)
Total Protein: 6.9 g/dL (ref 6.0–8.3)

## 2012-03-17 LAB — CBC WITH DIFFERENTIAL/PLATELET
Basophils Absolute: 0 10*3/uL (ref 0.0–0.1)
Basophils Relative: 0.5 % (ref 0.0–3.0)
Eosinophils Absolute: 0.2 10*3/uL (ref 0.0–0.7)
Eosinophils Relative: 3.7 % (ref 0.0–5.0)
HCT: 45.5 % (ref 39.0–52.0)
Hemoglobin: 14.9 g/dL (ref 13.0–17.0)
Lymphocytes Relative: 30.4 % (ref 12.0–46.0)
Lymphs Abs: 1.7 10*3/uL (ref 0.7–4.0)
MCHC: 32.8 g/dL (ref 30.0–36.0)
MCV: 91.8 fl (ref 78.0–100.0)
Monocytes Absolute: 0.5 10*3/uL (ref 0.1–1.0)
Monocytes Relative: 9.7 % (ref 3.0–12.0)
Neutro Abs: 3.1 10*3/uL (ref 1.4–7.7)
Neutrophils Relative %: 55.7 % (ref 43.0–77.0)
Platelets: 195 10*3/uL (ref 150.0–400.0)
RBC: 4.96 Mil/uL (ref 4.22–5.81)
RDW: 13.6 % (ref 11.5–14.6)
WBC: 5.6 10*3/uL (ref 4.5–10.5)

## 2012-03-17 LAB — TSH: TSH: 1.13 u[IU]/mL (ref 0.35–5.50)

## 2012-03-17 MED ORDER — PRASUGREL HCL 10 MG PO TABS: 10.0000 mg | ORAL_TABLET | Freq: Every day | ORAL | Status: DC

## 2012-03-17 MED ORDER — LISINOPRIL 2.5 MG PO TABS: ORAL_TABLET | ORAL | Status: DC

## 2012-03-17 MED ORDER — ATORVASTATIN CALCIUM 40 MG PO TABS: 40.0000 mg | ORAL_TABLET | Freq: Every day | ORAL | Status: DC

## 2012-03-17 MED ORDER — METOPROLOL SUCCINATE 12.5 MG HALF TABLET: ORAL_TABLET | ORAL | Status: DC

## 2012-03-17 NOTE — Addendum Note (Signed)
Addended by: Willy Eddy on: 03/17/2012 11:48 AM   Modules accepted: Orders

## 2012-03-17 NOTE — Patient Instructions (Signed)
Decrease the Toprol to 6.25 mg daily  Decrease the lisinopril to one tablet daily  Continue the Lipitor and the blood thinner  We will set you up for a screening colonoscopy  Return in one year for general medical exam sooner if any problems  Because of the family history of coronary disease I would advise her son have a complete evaluation and start on a cholesterol-lowering drug now

## 2012-03-17 NOTE — Progress Notes (Signed)
Subjective:    Patient ID: Blake Evans, male    DOB: Feb 07, 1951, 61 y.o.   MRN: [id number]  HPI Blake Evans is a 61 year old divorced male nonsmoker who comes in today as a new patient,,,,,,,,, I last saw him about 10 years ago,,,,,, for general physical evaluation because he recently had a heart attack with stents  In June he had the sudden onset of severe chest pain. He presented to [facility] long emergency room. He was history eyes to the Cath Lab ,,,,,,,,, angiogram showed a total right and disease in the circumflex and the LAD. He had an angioplasty the right with a stent and 2 stents one in the circumflex one in the LAD. ,,,,,,,,,, no recurrent chest pain. He just finished his rehabilitation and indeed with diet and exercise he states that he's lost about 20 pounds.  Medications reviewed the been no changes.  Prior to this he's been seeing Dr. Viviann Spare D. because of BPH and elevated PSA. He is on medication and currently stable.  BP today 114/70 he states he is lightheaded when he stands up. We'll cut his beta blocker to 6.25 mg. The rest of his medications I would leave alone.  He's had no other hospitalizations prior to above. No major illnesses or injuries no med allergies. He's never been a smoker.  Family history pertinent his father had his first MI when he was 22. Ricco Dershem did talk to his son his 18 and have him start on a lipid lowering drug  He's also due to see Dr. Londell Moh who he met in rehabilitation for a skin evaluation.    Review of Systems  Constitutional: Negative.   HENT: Negative.   Eyes: Negative.   Respiratory: Negative.   Cardiovascular: Negative.   Gastrointestinal: Negative.   Genitourinary: Negative.   Musculoskeletal: Negative.   Skin: Negative.   Neurological: Negative.   Hematological: Negative.   Psychiatric/Behavioral: Negative.        Objective:   Physical Exam  Constitutional: He is oriented to person, place, and time. He appears  well-developed and well-nourished.  HENT:  Head: Normocephalic and atraumatic.  Right Ear: External ear normal.  Left Ear: External ear normal.  Nose: Nose normal.  Mouth/Throat: Oropharynx is clear and moist.  Eyes: Conjunctivae normal and EOM are normal. Pupils are equal, round, and reactive to light.  Neck: Normal range of motion. Neck supple. No JVD present. No tracheal deviation present. No thyromegaly present.  Cardiovascular: Normal rate, regular rhythm, normal heart sounds and intact distal pulses.  Exam reveals no gallop and no friction rub.   No murmur heard.      No carotid bruits aorta normal peripheral pulses normal  Pulmonary/Chest: Effort normal and breath sounds normal. No stridor. No respiratory distress. He has no wheezes. He has no rales. He exhibits no tenderness.  Abdominal: Soft. Bowel sounds are normal. He exhibits no distension and no mass. There is no tenderness. There is no rebound and no guarding.  Genitourinary:       Urologic exam done by urologist  Musculoskeletal: Normal range of motion. He exhibits no edema and no tenderness.  Lymphadenopathy:    He has no cervical adenopathy.  Neurological: He is alert and oriented to person, place, and time. He has normal reflexes. No cranial nerve deficit. He exhibits normal muscle tone.  Skin: Skin is warm and dry. No rash noted. No erythema. No pallor.       Total body skin exam shows numerous freckles moles seborrheic  keratosis a red lesion on his posterior shoulder for which he is going to see dermatology  Psychiatric: He has a normal mood and affect. His behavior is normal. Judgment and thought content normal.          Assessment & Plan:  Healthy male  Coronary disease status post MI with stents x3 continue current medication  History of BPH and elevated PSA followed by Dr. Saul Fordyce.  We'll set him up for a screening colonoscopy since he's never had one. Also advised him to come back and sees yearly for  general medical exam  Because his blood pressure is low is lightheaded when he stands up we'll decrease his Toprol to 6 mg daily  Because of the family history of coronary disease and again advised his son to have an evaluation and start on a statin

## 2012-03-18 NOTE — Progress Notes (Signed)
Quick Note:  Spoke with Blake Evans- informed of results and dr. [name] instructions for repeat lab - has appt in 3 mos with urology - will f/u with them . ______

## 2012-04-05 ENCOUNTER — Other Ambulatory Visit: Payer: Self-pay | Admitting: Family Medicine

## 2012-04-05 ENCOUNTER — Other Ambulatory Visit: Payer: Self-pay | Admitting: Dermatology

## 2012-04-05 ENCOUNTER — Telehealth: Payer: Self-pay | Admitting: Family Medicine

## 2012-04-05 DIAGNOSIS — I2510 Atherosclerotic heart disease of native coronary artery without angina pectoris: Secondary | ICD-10-CM

## 2012-04-05 MED ORDER — METOPROLOL SUCCINATE 12.5 MG HALF TABLET: ORAL_TABLET | ORAL | Status: DC

## 2012-04-05 NOTE — Telephone Encounter (Signed)
Directions please 

## 2012-04-05 NOTE — Telephone Encounter (Signed)
Pt called and said that Dr Tawanna Cooler was suppose to be changing pts meds from 30 day to 90 day supply and the dosage of the metoprolol XR to lower dose. Pt said according to pharmacy it has not been done yet. [facility],

## 2012-04-05 NOTE — Telephone Encounter (Signed)
Fleet Contras I checked the computer I sent this to the drugstore via e-mail however I resent it

## 2012-04-09 MED ORDER — LISINOPRIL 2.5 MG PO TABS: ORAL_TABLET | ORAL | Status: DC

## 2012-04-09 MED ORDER — NAPROXEN SODIUM 220 MG PO TABS: 220.0000 mg | ORAL_TABLET | Freq: Two times a day (BID) | ORAL | Status: DC | PRN

## 2012-04-09 MED ORDER — NITROGLYCERIN 0.4 MG SL SUBL: 0.4000 mg | SUBLINGUAL_TABLET | SUBLINGUAL | Status: DC | PRN

## 2012-04-09 MED ORDER — ATORVASTATIN CALCIUM 40 MG PO TABS: 40.0000 mg | ORAL_TABLET | Freq: Every day | ORAL | Status: DC

## 2012-04-09 MED ORDER — PRASUGREL HCL 10 MG PO TABS: 10.0000 mg | ORAL_TABLET | Freq: Every day | ORAL | Status: DC

## 2012-04-09 MED ORDER — DUTASTERIDE-TAMSULOSIN HCL 0.5-0.4 MG PO CAPS: 1.0000 | ORAL_CAPSULE | Freq: Every day | ORAL | Status: DC

## 2012-04-09 NOTE — Telephone Encounter (Signed)
Blake Evans,  [facility] just called back. Pt went there to get his rx, and there is nothing there. Per pt, these 6 meds were to be sent in, for 90-days, instead of 30:  atorvastatin (LIPITOR) 40 MG tablet  Dutasteride-Tamsulosin HCl (JALYN) 0.5-0.4 MG CAPS  lisinopril (PRINIVIL,ZESTRIL) 2.5 MG tablet  naproxen sodium (ANAPROX) 220 MG tablet  nitroGLYCERIN (NITROSTAT) 0.4 MG SL tablet  prasugrel (EFFIENT) 10 MG TABS

## 2012-04-09 NOTE — Telephone Encounter (Signed)
Resent

## 2012-07-09 ENCOUNTER — Other Ambulatory Visit: Payer: Self-pay | Admitting: Dermatology

## 2012-08-20 ENCOUNTER — Other Ambulatory Visit: Payer: Self-pay | Admitting: Dermatology

## 2012-11-26 ENCOUNTER — Other Ambulatory Visit: Payer: Self-pay

## 2012-11-26 DIAGNOSIS — I2510 Atherosclerotic heart disease of native coronary artery without angina pectoris: Secondary | ICD-10-CM

## 2012-11-26 MED ORDER — PRASUGREL HCL 10 MG PO TABS: 10.0000 mg | ORAL_TABLET | Freq: Every day | ORAL | Status: DC

## 2012-11-26 MED ORDER — ATORVASTATIN CALCIUM 40 MG PO TABS: 40.0000 mg | ORAL_TABLET | Freq: Every day | ORAL | Status: DC

## 2012-11-26 NOTE — Telephone Encounter (Signed)
Rx was sent to pharmacy electronically. 

## 2012-12-03 ENCOUNTER — Ambulatory Visit (INDEPENDENT_AMBULATORY_CARE_PROVIDER_SITE_OTHER): Payer: 59 | Admitting: Cardiovascular Disease

## 2012-12-03 ENCOUNTER — Encounter: Payer: Self-pay | Admitting: Cardiovascular Disease

## 2012-12-03 VITALS — BP 102/78 | HR 59 | Ht 72.0 in | Wt 156.0 lb

## 2012-12-03 DIAGNOSIS — I213 ST elevation (STEMI) myocardial infarction of unspecified site: Secondary | ICD-10-CM

## 2012-12-03 DIAGNOSIS — I2510 Atherosclerotic heart disease of native coronary artery without angina pectoris: Secondary | ICD-10-CM

## 2012-12-03 DIAGNOSIS — I219 Acute myocardial infarction, unspecified: Secondary | ICD-10-CM

## 2012-12-03 DIAGNOSIS — Z79899 Other long term (current) drug therapy: Secondary | ICD-10-CM

## 2012-12-03 DIAGNOSIS — E785 Hyperlipidemia, unspecified: Secondary | ICD-10-CM

## 2012-12-03 MED ORDER — CLOPIDOGREL BISULFATE 75 MG PO TABS: 75.0000 mg | ORAL_TABLET | Freq: Every day | ORAL | Status: DC

## 2012-12-03 NOTE — Progress Notes (Signed)
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 His cardiovascular risk factor profile is positive for hyperlipidemia and a family history of a father that had bypass surgery. 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 I brought him to the cath lab at 2:00 in the morning and it demonstrated an occluded dominant distal RCA which I stented. He had circumflex and LAD diagonal branch disease as well. I brought him back 3 days later and in a staged fashion stented his distal LAD diagonal branch as well as his mid AV groove circumflex and he was discharged the following day. He feels well and has had no recurrent symptoms. He has modified his diet and participated in cardiac rehab. Since I last saw him he denies chest pain or shortness of breath. He has lost 11 pounds. His diet has improved and his lipid profile is excellent. He's been on double echo teletherapy was evident for a year. I'm going to transition him to Plavix, get a P2 Y12 verifying out test to demonstrate Plavix effectiveness      Current Outpatient Prescriptions  Medication Sig Dispense Refill  . aspirin 81 MG tablet Take 81 mg by mouth daily.      Marland Kitchen atorvastatin (LIPITOR) 40 MG tablet Take 1 tablet (40 mg total) by mouth daily at 6 PM.  30 tablet  0  . Dutasteride-Tamsulosin HCl (JALYN) 0.5-0.4 MG CAPS Take 1 capsule by mouth daily.  90 capsule  3  . lisinopril (PRINIVIL,ZESTRIL) 2.5 MG tablet 2.5 mg 2 (two) times daily.      . metoprolol succinate (TOPROL-XL) 12.5 mg TB24 One half tab every morning  50 tablet  3  . Multiple Vitamin (MULTIVITAMIN) tablet Take 1 tablet by mouth daily.      . naproxen sodium (ANAPROX) 220 MG tablet Take  1 tablet (220 mg total) by mouth 2 (two) times daily as needed. For pain  180 tablet  3  . nitroGLYCERIN (NITROSTAT) 0.4 MG SL tablet Place 1 tablet (0.4 mg total) under the tongue every 5 (five) minutes x 3 doses as needed for chest pain.  25 tablet  4  . prasugrel (EFFIENT) 10 MG TABS Take 1 tablet (10 mg total) by mouth daily.  30 tablet  0  . saw palmetto 80 MG capsule Take 80 mg by mouth daily.       No current facility-administered medications for this visit.    No Known Allergies  History   Social History  . Marital Status: Single    Spouse Name: N/A    Number of Children: N/A  . Years of Education: N/A   Occupational History  . Not on file.   Social History Main Topics  . Smoking status: Never Smoker   . Smokeless tobacco: Not on file  . Alcohol Use: Yes     Comment: occasional  . Drug Use: No  . Sexually Active: Yes    Birth Control/ Protection: None   Other Topics Concern  . Not on file   Social History Narrative  . No narrative on file     Review of Systems: General: negative for chills, fever, night sweats or weight changes.  Cardiovascular: negative for chest pain, dyspnea on  exertion, edema, orthopnea, palpitations, paroxysmal nocturnal dyspnea or shortness of breath Dermatological: negative for rash Respiratory: negative for cough or wheezing Urologic: negative for hematuria Abdominal: negative for nausea, vomiting, diarrhea, bright red blood per rectum, melena, or hematemesis Neurologic: negative for visual changes, syncope, or dizziness All other systems reviewed and are otherwise negative except as noted above.    Blood pressure 102/78, pulse 59, height 6' (1.829 m), weight 156 lb (70.761 kg).  General appearance: alert and no distress Neck: no adenopathy, no carotid bruit, no JVD, supple, symmetrical, trachea midline and thyroid not enlarged, symmetric, no tenderness/mass/nodules Lungs: clear to auscultation bilaterally Heart: regular rate and  rhythm, S1, S2 normal, no murmur, click, rub or gallop Extremities: extremities normal, atraumatic, no cyanosis or edema  EKG sinus bradycardia at 59 without ST or T wave changes  ASSESSMENT AND PLAN:   STEMI (ST elevation myocardial infarction), ST elevation in inf. wall, s/p DES resolute to RCA 11/21/11 He denies chest pain or shortness of breath. Myoview performed 01/09/12 revealed inferobasal scar without significant ischemia.  Hyperlipidemia On statin drugs followed by his primary care physician. His last lipid profile performed 03/17/12 revealed a total of 431, LDL 57 and HDL of 61      [name] MD [facility], [facility] 12/03/2012 9:51 AM

## 2012-12-03 NOTE — Patient Instructions (Addendum)
  We will see you back in follow up in 1 year  When you finish the effient, start plavix 75mg  daily Dr Allyson Sabal has ordered for you to have bloodwork done 2 weeks after starting plavix.  The bloodwork can only be done at [facility].  Please take the papers provided when you go to have bloodwork.

## 2012-12-03 NOTE — Assessment & Plan Note (Signed)
On statin drugs followed by his primary care physician. His last lipid profile performed 03/17/12 revealed a total of 431, LDL 57 and HDL of 61

## 2012-12-03 NOTE — Assessment & Plan Note (Signed)
He denies chest pain or shortness of breath. Myoview performed 01/09/12 revealed inferobasal scar without significant ischemia.

## 2012-12-28 ENCOUNTER — Other Ambulatory Visit: Payer: Self-pay | Admitting: Cardiovascular Disease

## 2013-01-14 ENCOUNTER — Other Ambulatory Visit: Payer: Self-pay | Admitting: Cardiovascular Disease

## 2013-01-17 ENCOUNTER — Telehealth: Payer: Self-pay | Admitting: Cardiovascular Disease

## 2013-01-17 LAB — PLATELET INHIBITION P2Y12

## 2013-01-17 NOTE — Telephone Encounter (Signed)
noted 

## 2013-01-17 NOTE — Telephone Encounter (Signed)
They do not do the P2y12 platelet test-this is done at [facility]

## 2013-02-06 ENCOUNTER — Other Ambulatory Visit (HOSPITAL_COMMUNITY): Payer: Self-pay | Admitting: Cardiology

## 2013-02-08 NOTE — Telephone Encounter (Signed)
Rx was sent to pharmacy electronically. 

## 2013-04-04 ENCOUNTER — Other Ambulatory Visit: Payer: Self-pay | Admitting: Dermatology

## 2013-05-09 ENCOUNTER — Other Ambulatory Visit: Payer: Self-pay | Admitting: Family Medicine

## 2013-07-04 ENCOUNTER — Telehealth: Payer: Self-pay | Admitting: Cardiovascular Disease

## 2013-07-04 DIAGNOSIS — Z79899 Other long term (current) drug therapy: Secondary | ICD-10-CM

## 2013-07-04 NOTE — Telephone Encounter (Signed)
Pt called back.  Confirmed he has been taking Plavix since prescribed.  Lab order left at front desk.

## 2013-07-04 NOTE — Telephone Encounter (Signed)
Received a call stating that he need to pick up his lab order.Please call.

## 2013-07-04 NOTE — Telephone Encounter (Signed)
Returned call and pt verified x 2.  Pt stated a few months ago he went to get a lab done that Dr. Gwenlyn Found ordered and was told he had to go to [facility].  Stated he needs the order so he can go get it done.    Lab reordered and left at front desk for pick up.  Pt agreed to pick up lab order and go to [facility] lab to have drawn.  Returned call.  Left message to call back.  NEED TO MAKE SURE PT HAS BEEN TAKING PLAVIX.

## 2013-07-06 LAB — PLATELET INHIBITION P2Y12: Platelet Function  P2Y12: 114 [PRU] — ABNORMAL LOW (ref 194–418)

## 2013-07-12 ENCOUNTER — Encounter: Payer: Self-pay | Admitting: *Deleted

## 2013-08-12 ENCOUNTER — Other Ambulatory Visit: Payer: Self-pay | Admitting: Cardiovascular Disease

## 2013-08-12 NOTE — Telephone Encounter (Signed)
Rx was sent to pharmacy electronically. 

## 2013-11-02 ENCOUNTER — Other Ambulatory Visit: Payer: Self-pay | Admitting: Family Medicine

## 2013-11-22 ENCOUNTER — Other Ambulatory Visit: Payer: Self-pay | Admitting: Family Medicine

## 2013-12-25 ENCOUNTER — Other Ambulatory Visit: Payer: Self-pay | Admitting: Cardiovascular Disease

## 2013-12-26 NOTE — Telephone Encounter (Signed)
Rx refill sent to patient pharmacy   

## 2014-01-10 ENCOUNTER — Other Ambulatory Visit: Payer: Self-pay | Admitting: Cardiovascular Disease

## 2014-01-10 NOTE — Telephone Encounter (Signed)
Rx refill sent to patient pharmacy   

## 2014-01-31 ENCOUNTER — Other Ambulatory Visit: Payer: Self-pay | Admitting: Cardiovascular Disease

## 2014-01-31 NOTE — Telephone Encounter (Signed)
Rx was sent to pharmacy electronically. 

## 2014-03-03 ENCOUNTER — Other Ambulatory Visit: Payer: Self-pay | Admitting: Cardiovascular Disease

## 2014-03-03 NOTE — Telephone Encounter (Signed)
E-sent rx \ appointment 03/07/14

## 2014-03-07 ENCOUNTER — Encounter: Payer: Self-pay | Admitting: Cardiovascular Disease

## 2014-03-07 ENCOUNTER — Ambulatory Visit (INDEPENDENT_AMBULATORY_CARE_PROVIDER_SITE_OTHER): Payer: 59 | Admitting: Cardiovascular Disease

## 2014-03-07 VITALS — BP 114/76 | HR 55 | Ht 71.0 in | Wt 158.1 lb

## 2014-03-07 DIAGNOSIS — I2119 ST elevation (STEMI) myocardial infarction involving other coronary artery of inferior wall: Secondary | ICD-10-CM

## 2014-03-07 DIAGNOSIS — E782 Mixed hyperlipidemia: Secondary | ICD-10-CM

## 2014-03-07 DIAGNOSIS — Z79899 Other long term (current) drug therapy: Secondary | ICD-10-CM

## 2014-03-07 DIAGNOSIS — Z9889 Other specified postprocedural states: Secondary | ICD-10-CM

## 2014-03-07 DIAGNOSIS — I2111 ST elevation (STEMI) myocardial infarction involving right coronary artery: Secondary | ICD-10-CM

## 2014-03-07 DIAGNOSIS — Z95820 Peripheral vascular angioplasty status with implants and grafts: Secondary | ICD-10-CM

## 2014-03-07 DIAGNOSIS — I2510 Atherosclerotic heart disease of native coronary artery without angina pectoris: Secondary | ICD-10-CM

## 2014-03-07 MED ORDER — CLOPIDOGREL BISULFATE 75 MG PO TABS: ORAL_TABLET | ORAL | Status: DC

## 2014-03-07 MED ORDER — LISINOPRIL 2.5 MG PO TABS: ORAL_TABLET | ORAL | Status: DC

## 2014-03-07 MED ORDER — ATORVASTATIN CALCIUM 40 MG PO TABS: ORAL_TABLET | ORAL | Status: DC

## 2014-03-07 NOTE — Patient Instructions (Signed)
  We will see you back in follow up in 1 year with Dr Berry.   Dr Berry has ordered: Your physician recommends that you return for a FASTING lipid profile    

## 2014-03-07 NOTE — Assessment & Plan Note (Signed)
History of inferior STEMI with stenting 11/21/11 by myself. He had staged reduction on his LAD diagonal branch and circumflex 3 days later. He has been doing well since. He denies chest pain or shortness of breath.

## 2014-03-07 NOTE — Assessment & Plan Note (Signed)
On statin therapy. We will recheck a lipid liver profile. 

## 2014-03-07 NOTE — Progress Notes (Signed)
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 His cardiovascular risk factor profile is positive for hyperlipidemia and a family history of a father that had bypass surgery. 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 I brought him to the cath lab at 2:00 in the morning and it demonstrated an occluded dominant distal RCA which I stented. He had circumflex and LAD diagonal branch disease as well. I brought him back 3 days later and in a staged fashion stented his distal LAD diagonal branch as well as his mid AV groove circumflex and he was discharged the following day. He feels well and has had no recurrent symptoms. He has modified his diet and participated in cardiac rehab. Since I last saw him he denies chest pain or shortness of breath. He has lost 11 pounds. His diet has improved and his lipid profile is excellent. I'm  Transitioned him to Plavix after getting a  P2 Y12 verifying out test to demonstrate Plavix effectiveness   Current Outpatient Prescriptions  Medication Sig Dispense Refill  . aspirin 81 MG tablet Take 81 mg by mouth 2 (two) times daily.       Marland Kitchen atorvastatin (LIPITOR) 40 MG tablet TAKE 1 TABLET BY MOUTH DAILY AT 6 PM.  30 tablet  11  . clopidogrel (PLAVIX) 75 MG tablet TAKE 1 TABLET (75 MG TOTAL) BY MOUTH DAILY.  30 tablet  11  . finasteride (PROSCAR) 5 MG tablet Take 1 tablet by mouth daily.      Marland Kitchen lisinopril (PRINIVIL,ZESTRIL) 2.5 MG tablet TAKE 1 TABLET BY MOUTH 2 TIMES DAILY.  60 tablet  11  . Multiple Vitamin (MULTIVITAMIN) tablet Take 1 tablet by mouth daily.      . naproxen sodium (ANAPROX) 220 MG tablet Take 1 tablet (220 mg total) by mouth 2 (two) times daily as  needed. For pain  180 tablet  3  . saw palmetto 80 MG capsule Take 80 mg by mouth daily.      . nitroGLYCERIN (NITROSTAT) 0.4 MG SL tablet Place 1 tablet (0.4 mg total) under the tongue every 5 (five) minutes x 3 doses as needed for chest pain.  25 tablet  4   No current facility-administered medications for this visit.    No Known Allergies  History   Social History  . Marital Status: Single    Spouse Name: N/A    Number of Children: N/A  . Years of Education: N/A   Occupational History  . Not on file.   Social History Main Topics  . Smoking status: Never Smoker   . Smokeless tobacco: Not on file  . Alcohol Use: Yes     Comment: occasional  . Drug Use: No  . Sexual Activity: Yes    Birth Control/ Protection: None   Other Topics Concern  . Not on file   Social History Narrative  . No narrative on file     Review of Systems: General: negative for chills, fever, night sweats or weight changes.  Cardiovascular: negative for chest pain, dyspnea on exertion, edema, orthopnea, palpitations, paroxysmal nocturnal dyspnea or shortness of breath Dermatological: negative for rash Respiratory: negative for cough or wheezing Urologic: negative for hematuria Abdominal: negative  for nausea, vomiting, diarrhea, bright red blood per rectum, melena, or hematemesis Neurologic: negative for visual changes, syncope, or dizziness All other systems reviewed and are otherwise negative except as noted above.    Blood pressure 114/76, pulse 55, height 5\' 11"  (1.803 m), weight 158 lb 1.6 oz (71.714 kg).  General appearance: alert and no distress Neck: no adenopathy, no carotid bruit, no JVD, supple, symmetrical, trachea midline and thyroid not enlarged, symmetric, no tenderness/mass/nodules Lungs: clear to auscultation bilaterally Heart: regular rate and rhythm, S1, S2 normal, no murmur, click, rub or gallop Extremities: extremities normal, atraumatic, no cyanosis or edema  EKG sinus  bradycardia at 55 without ST or T wave changes  ASSESSMENT AND PLAN:   Hyperlipidemia On statin therapy. We will recheck a lipid liver profile  STEMI (ST elevation myocardial infarction), ST elevation in inf. wall, s/p DES resolute to RCA 11/21/11 History of inferior STEMI with stenting 11/21/11 by myself. He had staged reduction on his LAD diagonal branch and circumflex 3 days later. He has been doing well since. He denies chest pain or shortness of breath.      Lorretta Harp MD FACP,FACC,FAHA, [facility] 03/07/2014 11:33 AM

## 2014-03-09 LAB — HEPATIC FUNCTION PANEL
ALT: 35 U/L (ref 0–53)
AST: 28 U/L (ref 0–37)
Albumin: 4.3 g/dL (ref 3.5–5.2)
Alkaline Phosphatase: 46 U/L (ref 39–117)
Bilirubin, Direct: 0.4 mg/dL — ABNORMAL HIGH (ref 0.0–0.3)
Indirect Bilirubin: 1.5 mg/dL — ABNORMAL HIGH (ref 0.2–1.2)
Total Bilirubin: 1.9 mg/dL — ABNORMAL HIGH (ref 0.2–1.2)
Total Protein: 6.2 g/dL (ref 6.0–8.3)

## 2014-03-09 LAB — LIPID PANEL
Cholesterol: 128 mg/dL (ref 0–200)
HDL: 60 mg/dL (ref >39)
LDL Cholesterol: 54 mg/dL (ref 0–99)
Total CHOL/HDL Ratio: 2.1 Ratio
Triglycerides: 68 mg/dL (ref <150)
VLDL: 14 mg/dL (ref 0–40)

## 2014-03-10 ENCOUNTER — Encounter: Payer: Self-pay | Admitting: *Deleted

## 2014-04-16 ENCOUNTER — Other Ambulatory Visit: Payer: Self-pay | Admitting: Cardiovascular Disease

## 2014-04-17 NOTE — Telephone Encounter (Signed)
E sent to pharmacy 

## 2014-05-18 ENCOUNTER — Encounter (HOSPITAL_COMMUNITY): Payer: Self-pay | Admitting: Cardiovascular Disease

## 2014-08-09 ENCOUNTER — Other Ambulatory Visit: Payer: Self-pay | Admitting: Family Medicine

## 2014-09-28 ENCOUNTER — Other Ambulatory Visit: Payer: Self-pay | Admitting: Dermatology

## 2015-01-25 ENCOUNTER — Ambulatory Visit (INDEPENDENT_AMBULATORY_CARE_PROVIDER_SITE_OTHER): Payer: Commercial Managed Care - HMO | Admitting: Family Medicine

## 2015-01-25 ENCOUNTER — Ambulatory Visit (INDEPENDENT_AMBULATORY_CARE_PROVIDER_SITE_OTHER): Payer: Commercial Managed Care - HMO

## 2015-01-25 VITALS — BP 130/68 | HR 60 | Temp 98.2°F | Resp 18 | Ht 70.0 in | Wt 153.0 lb

## 2015-01-25 DIAGNOSIS — M542 Cervicalgia: Secondary | ICD-10-CM

## 2015-01-25 DIAGNOSIS — S0191XA Laceration without foreign body of unspecified part of head, initial encounter: Secondary | ICD-10-CM | POA: Diagnosis not present

## 2015-01-25 DIAGNOSIS — S0990XA Unspecified injury of head, initial encounter: Secondary | ICD-10-CM | POA: Diagnosis not present

## 2015-01-25 NOTE — Patient Instructions (Signed)
Concussion  A concussion, or closed-head injury, is a brain injury caused by a direct blow to the head or by a quick and sudden movement (jolt) of the head or neck. Concussions are usually not life-threatening. Even so, the effects of a concussion can be serious. If you have had a concussion before, you are more likely to experience concussion-like symptoms after a direct blow to the head.   CAUSES  · Direct blow to the head, such as from running into another player during a soccer game, being hit in a fight, or hitting your head on a hard surface.  · A jolt of the head or neck that causes the brain to move back and forth inside the skull, such as in a car crash.  SIGNS AND SYMPTOMS  The signs of a concussion can be hard to notice. Early on, they may be missed by you, family members, and health care providers. You may look fine but act or feel differently.  Symptoms are usually temporary, but they may last for days, weeks, or even longer. Some symptoms may appear right away while others may not show up for hours or days. Every head injury is different. Symptoms include:  · Mild to moderate headaches that will not go away.  · A feeling of pressure inside your head.  · Having more trouble than usual:  ¨ Learning or remembering things you have heard.  ¨ Answering questions.  ¨ Paying attention or concentrating.  ¨ Organizing daily tasks.  ¨ Making decisions and solving problems.  · Slowness in thinking, acting or reacting, speaking, or reading.  · Getting lost or being easily confused.  · Feeling tired all the time or lacking energy (fatigued).  · Feeling drowsy.  · Sleep disturbances.  ¨ Sleeping more than usual.  ¨ Sleeping less than usual.  ¨ Trouble falling asleep.  ¨ Trouble sleeping (insomnia).  · Loss of balance or feeling lightheaded or dizzy.  · Nausea or vomiting.  · Numbness or tingling.  · Increased sensitivity to:  ¨ Sounds.  ¨ Lights.  ¨ Distractions.  · Vision problems or eyes that tire  easily.  · Diminished sense of taste or smell.  · Ringing in the ears.  · Mood changes such as feeling sad or anxious.  · Becoming easily irritated or angry for little or no reason.  · Lack of motivation.  · Seeing or hearing things other people do not see or hear (hallucinations).  DIAGNOSIS  Your health care provider can usually diagnose a concussion based on a description of your injury and symptoms. He or she will ask whether you passed out (lost consciousness) and whether you are having trouble remembering events that happened right before and during your injury.  Your evaluation might include:  · A brain scan to look for signs of injury to the brain. Even if the test shows no injury, you may still have a concussion.  · Blood tests to be sure other problems are not present.  TREATMENT  · Concussions are usually treated in an emergency department, in urgent care, or at a clinic. You may need to stay in the hospital overnight for further treatment.  · Tell your health care provider if you are taking any medicines, including prescription medicines, over-the-counter medicines, and natural remedies. Some medicines, such as blood thinners (anticoagulants) and aspirin, may increase the chance of complications. Also tell your health care provider whether you have had alcohol or are taking illegal drugs. This information   may affect treatment.  · Your health care provider will send you home with important instructions to follow.  · How fast you will recover from a concussion depends on many factors. These factors include how severe your concussion is, what part of your brain was injured, your age, and how healthy you were before the concussion.  · Most people with mild injuries recover fully. Recovery can take time. In general, recovery is slower in older persons. Also, persons who have had a concussion in the past or have other medical problems may find that it takes longer to recover from their current injury.  HOME  CARE INSTRUCTIONS  General Instructions  · Carefully follow the directions your health care provider gave you.  · Only take over-the-counter or prescription medicines for pain, discomfort, or fever as directed by your health care provider.  · Take only those medicines that your health care provider has approved.  · Do not drink alcohol until your health care provider says you are well enough to do so. Alcohol and certain other drugs may slow your recovery and can put you at risk of further injury.  · If it is harder than usual to remember things, write them down.  · If you are easily distracted, try to do one thing at a time. For example, do not try to watch TV while fixing dinner.  · Talk with family members or close friends when making important decisions.  · Keep all follow-up appointments. Repeated evaluation of your symptoms is recommended for your recovery.  · Watch your symptoms and tell others to do the same. Complications sometimes occur after a concussion. Older adults with a brain injury may have a higher risk of serious complications, such as a blood clot on the brain.  · Tell your teachers, school nurse, school counselor, coach, athletic trainer, or work manager about your injury, symptoms, and restrictions. Tell them about what you can or cannot do. They should watch for:  ¨ Increased problems with attention or concentration.  ¨ Increased difficulty remembering or learning new information.  ¨ Increased time needed to complete tasks or assignments.  ¨ Increased irritability or decreased ability to cope with stress.  ¨ Increased symptoms.  · Rest. Rest helps the brain to heal. Make sure you:  ¨ Get plenty of sleep at night. Avoid staying up late at night.  ¨ Keep the same bedtime hours on weekends and weekdays.  ¨ Rest during the day. Take daytime naps or rest breaks when you feel tired.  · Limit activities that require a lot of thought or concentration. These include:  ¨ Doing homework or job-related  work.  ¨ Watching TV.  ¨ Working on the computer.  · Avoid any situation where there is potential for another head injury (football, hockey, soccer, basketball, martial arts, downhill snow sports and horseback riding). Your condition will get worse every time you experience a concussion. You should avoid these activities until you are evaluated by the appropriate follow-up health care providers.  Returning To Your Regular Activities  You will need to return to your normal activities slowly, not all at once. You must give your body and brain enough time for recovery.  · Do not return to sports or other athletic activities until your health care provider tells you it is safe to do so.  · Ask your health care provider when you can drive, ride a bicycle, or operate heavy machinery. Your ability to react may be slower after a   brain injury. Never do these activities if you are dizzy.  · Ask your health care provider about when you can return to work or school.  Preventing Another Concussion  It is very important to avoid another brain injury, especially before you have recovered. In rare cases, another injury can lead to permanent brain damage, brain swelling, or death. The risk of this is greatest during the first 7-10 days after a head injury. Avoid injuries by:  · Wearing a seat belt when riding in a car.  · Drinking alcohol only in moderation.  · Wearing a helmet when biking, skiing, skateboarding, skating, or doing similar activities.  · Avoiding activities that could lead to a second concussion, such as contact or recreational sports, until your health care provider says it is okay.  · Taking safety measures in your home.  ¨ Remove clutter and tripping hazards from floors and stairways.  ¨ Use grab bars in bathrooms and handrails by stairs.  ¨ Place non-slip mats on floors and in bathtubs.  ¨ Improve lighting in dim areas.  SEEK MEDICAL CARE IF:  · You have increased problems paying attention or  concentrating.  · You have increased difficulty remembering or learning new information.  · You need more time to complete tasks or assignments than before.  · You have increased irritability or decreased ability to cope with stress.  · You have more symptoms than before.  Seek medical care if you have any of the following symptoms for more than 2 weeks after your injury:  · Lasting (chronic) headaches.  · Dizziness or balance problems.  · Nausea.  · Vision problems.  · Increased sensitivity to noise or light.  · Depression or mood swings.  · Anxiety or irritability.  · Memory problems.  · Difficulty concentrating or paying attention.  · Sleep problems.  · Feeling tired all the time.  SEEK IMMEDIATE MEDICAL CARE IF:  · You have severe or worsening headaches. These may be a sign of a blood clot in the brain.  · You have weakness (even if only in one hand, leg, or part of the face).  · You have numbness.  · You have decreased coordination.  · You vomit repeatedly.  · You have increased sleepiness.  · One pupil is larger than the other.  · You have convulsions.  · You have slurred speech.  · You have increased confusion. This may be a sign of a blood clot in the brain.  · You have increased restlessness, agitation, or irritability.  · You are unable to recognize people or places.  · You have neck pain.  · It is difficult to wake you up.  · You have unusual behavior changes.  · You lose consciousness.  MAKE SURE YOU:  · Understand these instructions.  · Will watch your condition.  · Will get help right away if you are not doing well or get worse.  Document Released: 08/16/2003 Document Revised: 05/31/2013 Document Reviewed: 12/16/2012  Exit[employer]® Patient Information ©2015 [employer]. This information is not intended to replace advice given to you by your health care provider. Make sure you discuss any questions you have with your health care provider.

## 2015-01-25 NOTE — Progress Notes (Signed)
 Chief Complaint:  Chief Complaint  Patient presents with  . cut on back of head    today    HPI: Blake Evans is a 64 y.o. male who reports to [facility] today complaining of laceration of head after falling off step stool.  HE has no LOC or Headache, has some right shoudler and neck pain, No nause avmiting, Was reaching for somethng and was off balance on step stool.  No dizziness, vision changes, n/v/ abd pain ; he is on Plavix  Past Medical History  Diagnosis Date  . Hypercholesteremia   . STEMI (ST elevation myocardial infarction), ST elevation in inf. wall 11/21/2011  . BPH (benign prostatic hyperplasia) 11/21/2011  . CAD (coronary artery disease), Residual disease of LAD and LCX 11/21/2011    myoview 01/09/12-positive bruce treadmill test with ventricular bigemeny, 2-3 mm horizontal ST segment depressions in II,III, AVF at peak exercise  . Kidney stone   . S/P angioplasty with stent, to LAD/Diag and AV groove, LCX.  11/24/11 11/25/2011  . Hyperlipidemia 11/25/2011   Past Surgical History  Procedure Laterality Date  . No past surgeries    . Coronary angioplasty with stent placement  11/21/11    inferior wall MI; PCI and stenting to distal RCA, Resolute DES 3x35mm  . Coronary angioplasty with stent placement  11/24/11    distal LAD diag branch PCI and stenting with a resolute DES along with mid AV groove circ PCI with a resolute DES   . Left heart catheterization with coronary angiogram N/A 11/21/2011    Procedure: LEFT HEART CATHETERIZATION WITH CORONARY ANGIOGRAM;  Surgeon: Lorretta Harp, MD;  Location: [facility] CATH LAB;  Service: Cardiovascular;  Laterality: N/A;  . Percutaneous coronary stent intervention (pci-s)  11/21/2011    Procedure: PERCUTANEOUS CORONARY STENT INTERVENTION (PCI-S);  Surgeon: Lorretta Harp, MD;  Location: [facility] CATH LAB;  Service: Cardiovascular;;  . Percutaneous coronary stent intervention (pci-s) N/A 11/24/2011    Procedure: PERCUTANEOUS CORONARY STENT  INTERVENTION (PCI-S);  Surgeon: Lorretta Harp, MD;  Location: [facility] CATH LAB;  Service: Cardiovascular;  Laterality: N/A;   Social History   Social History  . Marital Status: Single    Spouse Name: N/A  . Number of Children: N/A  . Years of Education: N/A   Social History Main Topics  . Smoking status: Never Smoker   . Smokeless tobacco: None  . Alcohol Use: Yes     Comment: occasional  . Drug Use: No  . Sexual Activity: Yes    Birth Control/ Protection: None   Other Topics Concern  . None   Social History Narrative   Family History  Problem Relation Age of Onset  . Coronary artery disease Father   . Heart disease Father   . Hyperlipidemia Father   . Diabetes Mother   . Diabetes Sister    No Known Allergies Prior to Admission medications   Medication Sig Start Date End Date Taking? Authorizing Provider  aspirin 81 MG tablet Take 81 mg by mouth 2 (two) times daily.    Yes Historical Provider, MD  atorvastatin (LIPITOR) 40 MG tablet TAKE 1 TABLET BY MOUTH DAILY AT 6 PM. 03/07/14  Yes Lorretta Harp, MD  clopidogrel (PLAVIX) 75 MG tablet TAKE 1 TABLET (75 MG TOTAL) BY MOUTH DAILY. 03/07/14  Yes Lorretta Harp, MD  clopidogrel (PLAVIX) 75 MG tablet TAKE 1 TABLET (75 MG TOTAL) BY MOUTH DAILY. 04/17/14  Yes Lorretta Harp, MD  finasteride (  PROSCAR) 5 MG tablet Take 1 tablet by mouth daily. 02/09/14  Yes Historical Provider, MD  lisinopril (PRINIVIL,ZESTRIL) 2.5 MG tablet TAKE 1 TABLET BY MOUTH 2 TIMES DAILY. 03/07/14  Yes Lorretta Harp, MD  Multiple Vitamin (MULTIVITAMIN) tablet Take 1 tablet by mouth daily.   Yes Historical Provider, MD  saw palmetto 80 MG capsule Take 80 mg by mouth daily.   Yes Historical Provider, MD  naproxen sodium (ANAPROX) 220 MG tablet Take 1 tablet (220 mg total) by mouth 2 (two) times daily as needed. For pain Patient not taking: Reported on 01/25/2015 04/09/12   Dorena Cookey, MD  nitroGLYCERIN (NITROSTAT) 0.4 MG SL tablet Place 1 tablet (0.4 mg  total) under the tongue every 5 (five) minutes x 3 doses as needed for chest pain. 04/09/12 04/09/13  Dorena Cookey, MD     ROS: The patient denies fevers, chills, night sweats, unintentional weight loss, chest pain, palpitations, wheezing, dyspnea on exertion, nausea, vomiting, abdominal pain, dysuria, hematuria, melena, numbness, weakness, or tingling.  All other systems have been reviewed and were otherwise negative with the exception of those mentioned in the HPI and as above.    PHYSICAL EXAM: Filed Vitals:   01/25/15 1258  BP: 130/68  Pulse: 60  Temp: 98.2 F (36.8 C)  Resp: 18   Body mass index is 21.95 kg/(m^2).   General: Alert, no acute distress HEENT:  Normocephalic, atraumatic, oropharynx patent. EOMI, PERRLA Cardiovascular:  Regular rate and rhythm, no rubs murmurs or gallops.  No Carotid bruits, radial pulse intact. No pedal edema.  Respiratory: Clear to auscultation bilaterally.  No wheezes, rales, or rhonchi.  No cyanosis, no use of accessory musculature Abdominal: No organomegaly, abdomen is soft and non-tender, positive bowel sounds. No masses. Skin: + skin laceration Neurologic: Facial musculature symmetric. Psychiatric: Patient acts appropriately throughout our interaction. Lymphatic: No cervical or submandibular lymphadenopathy Musculoskeletal: Gait intact. No edema, tenderness Minimal tenderness of c spine, full ROM Ecchymosis of left elbow, nontender, full ROm and nl strength CN 2-12 grossly intact No battle signs, no gum nose or ear bleeding, fundo exam normal    LABS: Results for orders placed or performed in visit on 03/07/14  Lipid panel  Result Value Ref Range   Cholesterol 128 0 - 200 mg/dL   Triglycerides 68 <150 mg/dL   HDL 60 >39 mg/dL   Total CHOL/HDL Ratio 2.1 Ratio   VLDL 14 0 - 40 mg/dL   LDL Cholesterol 54 0 - 99 mg/dL  Hepatic function panel  Result Value Ref Range   Total Bilirubin 1.9 (H) 0.2 - 1.2 mg/dL   Bilirubin, Direct 0.4  (H) 0.0 - 0.3 mg/dL   Indirect Bilirubin 1.5 (H) 0.2 - 1.2 mg/dL   Alkaline Phosphatase 46 39 - 117 U/L   AST 28 0 - 37 U/L   ALT 35 0 - 53 U/L   Total Protein 6.2 6.0 - 8.3 g/dL   Albumin 4.3 3.5 - 5.2 g/dL     EKG/XRAY:   Primary read interpreted by Dr. Marin Comment at [facility]. Please comment if opaque posterior bony lesion is acute fracture or chronic opacity   ASSESSMENT/PLAN: Encounter Diagnoses  Name Primary?  . Neck pain Yes  . Laceration of head, initial encounter   . Head injury, initial encounter    Superficial posterior laceration, drysol was used to stop bleeding , was successful Precautions given Fu prn  Gross sideeffects, risk and benefits, and alternatives of medications d/w patient. Patient is aware  that all medications have potential sideeffects and we are unable to predict every sideeffect or drug-drug interaction that may occur.    DO  01/25/2015 1:36 PM

## 2015-03-27 ENCOUNTER — Other Ambulatory Visit: Payer: Self-pay | Admitting: Cardiovascular Disease

## 2015-04-09 ENCOUNTER — Other Ambulatory Visit: Payer: Self-pay | Admitting: Cardiovascular Disease

## 2015-04-19 ENCOUNTER — Ambulatory Visit (INDEPENDENT_AMBULATORY_CARE_PROVIDER_SITE_OTHER): Payer: 59 | Admitting: Family Medicine

## 2015-04-19 ENCOUNTER — Ambulatory Visit (INDEPENDENT_AMBULATORY_CARE_PROVIDER_SITE_OTHER): Payer: 59

## 2015-04-19 VITALS — BP 112/68 | HR 64 | Temp 98.3°F | Resp 18 | Ht 71.0 in | Wt 158.4 lb

## 2015-04-19 DIAGNOSIS — M79641 Pain in right hand: Secondary | ICD-10-CM

## 2015-04-19 DIAGNOSIS — M79644 Pain in right finger(s): Secondary | ICD-10-CM | POA: Diagnosis not present

## 2015-04-19 NOTE — Patient Instructions (Signed)
I'm not seeing any fracture on the x-ray. Sometimes the swelling and bone pain can result in some weakness because of the pressure on nerves in the area.  If you're not finding that your strength is coming back to normal by Sunday, please give me a call so we can arrange further evaluation

## 2015-04-19 NOTE — Progress Notes (Signed)
This chart was scribed for Robyn Haber, MD by Moises Blood, medical scribe at [facility].The patient was seen in exam room 14 and the patient's care was started at 3:40 PM.  Patient ID: OLMAN CHATTIN MRN: [id number], DOB: March 18, 1951, 64 y.o. Date of Encounter: 04/19/2015  Primary Physician: Joycelyn Man, MD  Chief Complaint:  Chief Complaint  Patient presents with   Hand Injury    fell last sunday     HPI:  Blake Evans is a 64 y.o. male who presents to [facility] complaining of right hand injury due to a fall 5 days ago. Pt states that he was walking 5 days ago. He stepped on something and rolled his left ankle with a mild sprain. He can walk but it is a bit sore. When he fell, he used his right hand to brace the fall. Initially, there was no pain in his right hand, but throughout the week, the pain started and has gotten worse. He also feels that his right hand's grip strength has weakened.   He is retired. But, he does self consulting work.   Past Medical History  Diagnosis Date   Hypercholesteremia    STEMI (ST elevation myocardial infarction), ST elevation in inf. wall 11/21/2011   BPH (benign prostatic hyperplasia) 11/21/2011   CAD (coronary artery disease), Residual disease of LAD and LCX 11/21/2011    myoview 01/09/12-positive bruce treadmill test with ventricular bigemeny, 2-3 mm horizontal ST segment depressions in II,III, AVF at peak exercise   Kidney stone    S/P angioplasty with stent, to LAD/Diag and AV groove, LCX.  11/24/11 11/25/2011   Hyperlipidemia 11/25/2011     Home Meds: Prior to Admission medications   Medication Sig Start Date End Date Taking? Authorizing Provider  aspirin 81 MG tablet Take 81 mg by mouth 2 (two) times daily.    Yes Historical Provider, MD  atorvastatin (LIPITOR) 40 MG tablet TAKE 1 TABLET BY MOUTH DAILY AT 6 PM. 04/10/15  Yes Lorretta Harp, MD  clopidogrel (PLAVIX) 75 MG tablet TAKE  1 TABLET (75 MG TOTAL) BY MOUTH DAILY. 03/07/14  Yes Lorretta Harp, MD  finasteride (PROSCAR) 5 MG tablet Take 1 tablet by mouth daily. 02/09/14  Yes Historical Provider, MD  lisinopril (PRINIVIL,ZESTRIL) 2.5 MG tablet TAKE 1 TABLET BY MOUTH 2 TIMES DAILY. 03/27/15  Yes Lorretta Harp, MD  Multiple Vitamin (MULTIVITAMIN) tablet Take 1 tablet by mouth daily.   Yes Historical Provider, MD  naproxen sodium (ANAPROX) 220 MG tablet Take 1 tablet (220 mg total) by mouth 2 (two) times daily as needed. For pain 04/09/12  Yes Dorena Cookey, MD  saw palmetto 80 MG capsule Take 80 mg by mouth daily.   Yes Historical Provider, MD  clopidogrel (PLAVIX) 75 MG tablet TAKE 1 TABLET (75 MG TOTAL) BY MOUTH DAILY. Patient not taking: Reported on 04/19/2015 04/17/14   Lorretta Harp, MD  nitroGLYCERIN (NITROSTAT) 0.4 MG SL tablet Place 1 tablet (0.4 mg total) under the tongue every 5 (five) minutes x 3 doses as needed for chest pain. 04/09/12 04/09/13  Dorena Cookey, MD    Allergies: No Known Allergies  Social History   Social History   Marital Status: Single    Spouse Name: N/A   Number of Children: N/A   Years of Education: N/A   Occupational History   Not on file.   Social History Main Topics   Smoking status: Never  Smoker    Smokeless tobacco: Not on file   Alcohol Use: Yes     Comment: occasional   Drug Use: No   Sexual Activity: Yes    Birth Control/ Protection: None   Other Topics Concern   Not on file   Social History Narrative     Review of Systems: Constitutional: negative for fever, chills, night sweats, weight changes, or fatigue  HEENT: negative for vision changes, hearing loss, congestion, rhinorrhea, ST, epistaxis, or sinus pressure Cardiovascular: negative for chest pain or palpitations Respiratory: negative for hemoptysis, wheezing, shortness of breath, or cough Abdominal: negative for abdominal pain, nausea, vomiting, diarrhea, or constipation Dermatological:  negative for rash Neurologic: negative for headache, dizziness, or syncope Musc: positive for arthralgia (right hand, left ankle), myalgia (right hand)  All other systems reviewed and are otherwise negative with the exception to those above and in the HPI.  Physical Exam: Blood pressure 112/68, pulse 64, temperature 98.3 F (36.8 C), temperature source Oral, resp. rate 18, height 5\' 11"  (1.803 m), weight 158 lb 6.4 oz (71.85 kg), SpO2 98 %., Body mass index is 22.1 kg/(m^2). General: Well developed, well nourished, in no acute distress. Head: Normocephalic, atraumatic, eyes without discharge, sclera non-icteric, nares are without discharge. Bilateral auditory canals clear, TM's are without perforation, pearly grey and translucent with reflective cone of light bilaterally. Oral cavity moist, posterior pharynx without exudate, erythema, peritonsillar abscess, or post nasal drip.  Neck: Supple. No thyromegaly. Full ROM. No lymphadenopathy. Lungs: Clear bilaterally to auscultation without wheezes, rales, or rhonchi. Breathing is unlabored. Heart: RRR with S1 S2. No murmurs, rubs, or gallops appreciated. Abdomen: Soft, non-tender, non-distended with normoactive bowel sounds. No hepatomegaly. No rebound/guarding. No obvious abdominal masses. Msk:  Strength and tone normal for age. Extremities/Skin: Warm and dry. No clubbing or cyanosis. Tender at base of thumb palmar side with ecchymosis, full ROM, grip is a little weak; normal pincer strength Neuro: Alert and oriented X 3. Moves all extremities spontaneously. Gait is normal. CNII-XII grossly in tact. Psych:  Responds to questions appropriately with a normal affect.   [facility] reading (PRIMARY) by Dr. Joseph Art : xray right hand: no fractures seen   ASSESSMENT AND PLAN:  64 y.o. year old male with  This chart was scribed in my presence and reviewed by me personally.    ICD-9-CM ICD-10-CM   1. Right hand pain 729.5 M79.641 DG Hand Complete Right    2. Thumb pain, right 729.5 M79.644     If not improving in 72 hours, patient is to call me for further evaluation   By signing my name below, I, Moises Blood, attest that this documentation has been prepared under the direction and in the presence of Robyn Haber, MD. Electronically Signed: Moises Blood, [place]. 04/19/2015 , 3:40 PM .  Signed, Robyn Haber, MD 04/19/2015 3:40 PM

## 2015-04-23 ENCOUNTER — Other Ambulatory Visit: Payer: Self-pay | Admitting: Cardiovascular Disease

## 2015-04-27 ENCOUNTER — Telehealth: Payer: Self-pay | Admitting: Radiology

## 2015-04-27 ENCOUNTER — Telehealth: Payer: Self-pay

## 2015-04-27 NOTE — Telephone Encounter (Signed)
At the patient's request, I made a copy of his x-ray on CD , and placed in the pick up drawer. 

## 2015-04-27 NOTE — Telephone Encounter (Signed)
Patient states that he called Monday to request a copy of his hand x-ray and hasn't heard anything. There is no copy in the pick up drawer. Patient will be coming by today to pick it up.

## 2015-04-27 NOTE — Telephone Encounter (Signed)
Sent to xray.

## 2015-05-08 ENCOUNTER — Other Ambulatory Visit: Payer: Self-pay | Admitting: Cardiovascular Disease

## 2015-05-10 ENCOUNTER — Other Ambulatory Visit: Payer: Self-pay | Admitting: *Deleted

## 2015-05-10 MED ORDER — LISINOPRIL 2.5 MG PO TABS: 2.5000 mg | ORAL_TABLET | Freq: Every day | ORAL | Status: DC

## 2015-05-10 MED ORDER — ATORVASTATIN CALCIUM 40 MG PO TABS: 40.0000 mg | ORAL_TABLET | Freq: Every day | ORAL | Status: DC

## 2015-05-10 MED ORDER — CLOPIDOGREL BISULFATE 75 MG PO TABS: ORAL_TABLET | ORAL | Status: DC

## 2015-05-19 ENCOUNTER — Other Ambulatory Visit: Payer: Self-pay | Admitting: Family Medicine

## 2015-05-28 ENCOUNTER — Other Ambulatory Visit: Payer: Self-pay | Admitting: Cardiovascular Disease

## 2015-06-14 ENCOUNTER — Telehealth: Payer: Self-pay | Admitting: Family Medicine

## 2015-06-14 NOTE — Telephone Encounter (Signed)
Pt last saw dr Sherren Mocha 03-17-2012 and would like to re-est. Can I sch?

## 2015-06-14 NOTE — Telephone Encounter (Signed)
Dr Sherren Mocha would like for patient to establish with a new provider

## 2015-06-15 NOTE — Telephone Encounter (Signed)
Pt has been sch with cory

## 2015-06-18 ENCOUNTER — Other Ambulatory Visit: Payer: Self-pay | Admitting: Cardiovascular Disease

## 2015-07-02 ENCOUNTER — Other Ambulatory Visit: Payer: Self-pay | Admitting: Cardiovascular Disease

## 2015-07-02 NOTE — Telephone Encounter (Signed)
REFILL 

## 2015-07-03 ENCOUNTER — Other Ambulatory Visit: Payer: Self-pay | Admitting: Cardiovascular Disease

## 2015-07-11 ENCOUNTER — Other Ambulatory Visit: Payer: Self-pay | Admitting: Cardiovascular Disease

## 2015-07-11 NOTE — Telephone Encounter (Signed)
Rx(s) sent to pharmacy electronically.  

## 2015-08-03 ENCOUNTER — Ambulatory Visit (INDEPENDENT_AMBULATORY_CARE_PROVIDER_SITE_OTHER): Payer: Medicare Other | Admitting: Adult Health

## 2015-08-03 ENCOUNTER — Encounter: Payer: Self-pay | Admitting: Gastroenterology

## 2015-08-03 ENCOUNTER — Encounter: Payer: Self-pay | Admitting: Adult Health

## 2015-08-03 VITALS — BP 120/70 | HR 68 | Temp 98.9°F | Ht 70.67 in | Wt 160.4 lb

## 2015-08-03 DIAGNOSIS — Z1211 Encounter for screening for malignant neoplasm of colon: Secondary | ICD-10-CM

## 2015-08-03 DIAGNOSIS — I2111 ST elevation (STEMI) myocardial infarction involving right coronary artery: Secondary | ICD-10-CM

## 2015-08-03 DIAGNOSIS — I10 Essential (primary) hypertension: Secondary | ICD-10-CM | POA: Diagnosis not present

## 2015-08-03 DIAGNOSIS — K759 Inflammatory liver disease, unspecified: Secondary | ICD-10-CM | POA: Diagnosis not present

## 2015-08-03 DIAGNOSIS — Z7689 Persons encountering health services in other specified circumstances: Secondary | ICD-10-CM

## 2015-08-03 DIAGNOSIS — Z7189 Other specified counseling: Secondary | ICD-10-CM

## 2015-08-03 MED ORDER — NITROGLYCERIN 0.4 MG SL SUBL: 0.4000 mg | SUBLINGUAL_TABLET | SUBLINGUAL | Status: DC | PRN

## 2015-08-03 NOTE — Progress Notes (Signed)
Pre visit review using our clinic review tool, if applicable. No additional management support is needed unless otherwise documented below in the visit note. 

## 2015-08-03 NOTE — Patient Instructions (Signed)
It was great meeting you today!  I will follow up with you regarding your blood work.   Follow up with me for your complete physical. - Make sure you are fasting.  Please let me know if you need anything in the meantime.

## 2015-08-03 NOTE — Progress Notes (Signed)
Patient presents to clinic today to establish care. He is a pleasant caucasian male who  has a past medical history of STEMI (ST elevation myocardial infarction), ST elevation in inf. wall (11/21/2011); BPH (benign prostatic hyperplasia) (11/21/2011); CAD (coronary artery disease), Residual disease of LAD and LCX (11/21/2011); Kidney stone; S/P angioplasty with stent, to LAD/Diag and AV groove, LCX.  11/24/11 (11/25/2011); Hyperlipidemia (11/25/2011); and Colon polyps. He saw Dr. Sherren Mocha in the past. Has not had a physical for three years.    Acute Concerns:  Establish Care  Hepatitis - He was told in the 1980's that he had hepatitis. He does not remember which one he had and would like to be tested for this. He has not noticed any jaundice  Chronic Issues:  Cardiac History - He has a significnat cardiac history including Cardiac Stents x 3, STEMI, CAD, and Hypertension. He feels as though his hypertension and hyperlipidemia are under control.   Health Maintenance: Dental --Twice a year Vision -- Every two years Immunizations -- UTD Colonoscopy -- 2006   Is Followed by  Cardiology - Dr. Gwenlyn Found  Urology - Dr. Luberta Robertson Dermatology - Dr. Elvera Lennox   Past Medical History  Diagnosis Date  . Hypercholesteremia   . STEMI (ST elevation myocardial infarction), ST elevation in inf. wall 11/21/2011  . BPH (benign prostatic hyperplasia) 11/21/2011  . CAD (coronary artery disease), Residual disease of LAD and LCX 11/21/2011    myoview 01/09/12-positive bruce treadmill test with ventricular bigemeny, 2-3 mm horizontal ST segment depressions in II,III, AVF at peak exercise  . Kidney stone   . S/P angioplasty with stent, to LAD/Diag and AV groove, LCX.  11/24/11 11/25/2011  . Hyperlipidemia 11/25/2011    Past Surgical History  Procedure Laterality Date  . No past surgeries    . Coronary angioplasty with stent placement  11/21/11    inferior wall MI; PCI and stenting to distal RCA, Resolute DES  3x48mm  . Coronary angioplasty with stent placement  11/24/11    distal LAD diag branch PCI and stenting with a resolute DES along with mid AV groove circ PCI with a resolute DES   . Left heart catheterization with coronary angiogram N/A 11/21/2011    Procedure: LEFT HEART CATHETERIZATION WITH CORONARY ANGIOGRAM;  Surgeon: Lorretta Harp, MD;  Location: [facility] CATH LAB;  Service: Cardiovascular;  Laterality: N/A;  . Percutaneous coronary stent intervention (pci-s)  11/21/2011    Procedure: PERCUTANEOUS CORONARY STENT INTERVENTION (PCI-S);  Surgeon: Lorretta Harp, MD;  Location: [facility] CATH LAB;  Service: Cardiovascular;;  . Percutaneous coronary stent intervention (pci-s) N/A 11/24/2011    Procedure: PERCUTANEOUS CORONARY STENT INTERVENTION (PCI-S);  Surgeon: Lorretta Harp, MD;  Location: [facility] CATH LAB;  Service: Cardiovascular;  Laterality: N/A;    Current Outpatient Prescriptions on File Prior to Visit  Medication Sig Dispense Refill  . aspirin 81 MG tablet Take 81 mg by mouth 2 (two) times daily.     Marland Kitchen atorvastatin (LIPITOR) 40 MG tablet TAKE 1 TABLET BY MOUTH  DAILY AT 6 PM 90 tablet 0  . clopidogrel (PLAVIX) 75 MG tablet Take 1 tablet (75 mg total) by mouth daily. PATIENT NEEDS TO CONTACT OFFICE REFILLS 90 tablet 0  . finasteride (PROSCAR) 5 MG tablet Take 1 tablet by mouth daily.    Marland Kitchen lisinopril (PRINIVIL,ZESTRIL) 2.5 MG tablet Take 1 tablet by mouth  daily 90 tablet 0  . Multiple Vitamin (MULTIVITAMIN) tablet Take 1 tablet by mouth daily.    Marland Kitchen  saw palmetto 80 MG capsule Take 80 mg by mouth daily.    . naproxen sodium (ANAPROX) 220 MG tablet Take 1 tablet (220 mg total) by mouth 2 (two) times daily as needed. For pain (Patient not taking: Reported on 08/03/2015) 180 tablet 3  . nitroGLYCERIN (NITROSTAT) 0.4 MG SL tablet Place 1 tablet (0.4 mg total) under the tongue every 5 (five) minutes x 3 doses as needed for chest pain. 25 tablet 4   No current facility-administered medications on file  prior to visit.    No Known Allergies  Family History  Problem Relation Age of Onset  . Coronary artery disease Father   . Heart disease Father   . Hyperlipidemia Father   . Diabetes Mother   . Diabetes Sister     Social History   Social History  . Marital Status: Single    Spouse Name: N/A  . Number of Children: N/A  . Years of Education: N/A   Occupational History  . Not on file.   Social History Main Topics  . Smoking status: Never Smoker   . Smokeless tobacco: Not on file  . Alcohol Use: Yes     Comment: occasional  . Drug Use: No  . Sexual Activity: Yes    Birth Control/ Protection: None   Other Topics Concern  . Not on file   Social History Narrative    Review of Systems  HENT: Negative.   Eyes: Negative.   Respiratory: Negative.   Cardiovascular: Negative.   Gastrointestinal: Negative.   Genitourinary: Positive for frequency.  Musculoskeletal: Negative.   Skin: Negative.   Neurological: Negative.   Endo/Heme/Allergies: Negative.   Psychiatric/Behavioral: Negative.     BP 120/70 mmHg  Pulse 68  Temp(Src) 98.9 F (37.2 C) (Oral)  Ht 5' 10.67" (1.795 m)  Wt 160 lb 6.4 oz (72.757 kg)  BMI 22.58 kg/m2  Physical Exam  Constitutional: He is oriented to person, place, and time and well-developed, well-nourished, and in no distress. No distress.  HENT:  Head: Normocephalic and atraumatic.  Right Ear: External ear normal.  Left Ear: External ear normal.  Nose: Nose normal.  Mouth/Throat: Oropharynx is clear and moist. No oropharyngeal exudate.  Eyes: Conjunctivae and EOM are normal. Pupils are equal, round, and reactive to light. Right eye exhibits no discharge. Left eye exhibits no discharge. No scleral icterus.  Neck: Neck supple.  Cardiovascular: Normal rate, regular rhythm, normal heart sounds and intact distal pulses.  Exam reveals no gallop and no friction rub.   No murmur heard. Pulmonary/Chest: Effort normal and breath sounds normal. No  respiratory distress. He has no wheezes. He has no rales. He exhibits no tenderness.  Lymphadenopathy:    He has no cervical adenopathy.  Neurological: He is alert and oriented to person, place, and time. Gait normal. GCS score is 15.  Skin: Skin is warm and dry. No rash noted. He is not diaphoretic. No erythema. No pallor.  Psychiatric: Mood, memory, affect and judgment normal.  Nursing note and vitals reviewed.   Assessment/Plan:  1. Hepatitis - Hepatitis Acute Panel  2. Colon cancer screening - Ambulatory referral to Gastroenterology  3. Essential hypertension - nitroGLYCERIN (NITROSTAT) 0.4 MG SL tablet; Place 1 tablet (0.4 mg total) under the tongue every 5 (five) minutes x 3 doses as needed for chest pain.  Dispense: 25 tablet; Refill: 4  4. ST elevation myocardial infarction involving right coronary artery (HCC) - nitroGLYCERIN (NITROSTAT) 0.4 MG SL tablet; Place 1 tablet (0.4 mg  total) under the tongue every 5 (five) minutes x 3 doses as needed for chest pain.  Dispense: 25 tablet; Refill: 4  5. Encounter to establish care - Follow up for physical.  - follow up sooner if needed - Stressed the importance of diet and exercise.

## 2015-08-04 LAB — HEPATITIS PANEL, ACUTE
HCV Ab: NEGATIVE
Hep A IgM: NONREACTIVE
Hep B C IgM: NONREACTIVE
Hepatitis B Surface Ag: NEGATIVE

## 2015-08-08 ENCOUNTER — Other Ambulatory Visit: Payer: Self-pay | Admitting: Family Medicine

## 2015-09-04 ENCOUNTER — Other Ambulatory Visit: Payer: Self-pay | Admitting: Cardiovascular Disease

## 2015-09-04 NOTE — Telephone Encounter (Signed)
REFILL 

## 2015-09-13 ENCOUNTER — Encounter: Payer: Self-pay | Admitting: Cardiovascular Disease

## 2015-09-13 ENCOUNTER — Ambulatory Visit (INDEPENDENT_AMBULATORY_CARE_PROVIDER_SITE_OTHER): Payer: Medicare Other | Admitting: Cardiovascular Disease

## 2015-09-13 VITALS — BP 116/76 | HR 65 | Ht 72.0 in | Wt 157.2 lb

## 2015-09-13 DIAGNOSIS — I10 Essential (primary) hypertension: Secondary | ICD-10-CM | POA: Diagnosis not present

## 2015-09-13 DIAGNOSIS — I2111 ST elevation (STEMI) myocardial infarction involving right coronary artery: Secondary | ICD-10-CM | POA: Diagnosis not present

## 2015-09-13 DIAGNOSIS — E782 Mixed hyperlipidemia: Secondary | ICD-10-CM

## 2015-09-13 NOTE — Progress Notes (Signed)
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 I last saw him in the office 03/07/14.His cardiovascular risk factor profile is positive for hyperlipidemia and a family history of a father that had bypass surgery. 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 I brought him to the cath lab at 2:00 in the morning and it demonstrated an occluded dominant distal RCA which I stented. He had circumflex and LAD diagonal branch disease as well. I brought him back 3 days later and in a staged fashion stented his distal LAD diagonal branch as well as his mid AV groove circumflex and he was discharged the following day. He feels well and has had no recurrent symptoms. He has modified his diet and participated in cardiac rehab. Since I last saw him he denies chest pain or shortness of breath. He has lost 11 pounds.since I saw him in the office here ago he's maintain stable. Said a few episodes of atypical chest pain but otherwise has remained remarkably stable on dual antiplatelet therapy.  Current Outpatient Prescriptions  Medication Sig Dispense Refill  . aspirin 81 MG tablet Take 81 mg by mouth 2 (two) times daily.     Marland Kitchen atorvastatin (LIPITOR) 40 MG tablet TAKE 1 TABLET BY MOUTH  DAILY AT 6 PM 90 tablet 0  . clopidogrel (PLAVIX) 75 MG tablet Take 1 tablet (75 mg total) by mouth daily. NEED OV. 90 tablet 0  . finasteride (PROSCAR) 5 MG tablet Take 1 tablet by mouth daily.    Marland Kitchen lisinopril (PRINIVIL,ZESTRIL) 2.5 MG tablet Take 1 tablet by mouth  daily 90 tablet 0  . Multiple Vitamin (MULTIVITAMIN) tablet Take 1 tablet by mouth daily.    . nitroGLYCERIN (NITROSTAT) 0.4 MG SL tablet Place 1 tablet (0.4 mg total) under  the tongue every 5 (five) minutes x 3 doses as needed for chest pain. 25 tablet 4  . NITROSTAT 0.4 MG SL tablet PLACE 1 TABLET UNDER THE TONGUE EVERY 5 MINUTES FOR 3 DOSES AS NEEDED FOR CHEST PAIN 25 tablet 1  . saw palmetto 80 MG capsule Take 80 mg by mouth daily.     No current facility-administered medications for this visit.    No Known Allergies  Social History   Social History  . Marital Status: Single    Spouse Name: N/A  . Number of Children: N/A  . Years of Education: N/A   Occupational History  . Not on file.   Social History Main Topics  . Smoking status: Never Smoker   . Smokeless tobacco: Never Used  . Alcohol Use: 0.0 oz/week    0 Standard drinks or equivalent per week     Comment: occasional  . Drug Use: No  . Sexual Activity: Yes    Birth Control/ Protection: None   Other Topics Concern  . 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  Cardiovascular: negative for chest pain, dyspnea on exertion,  edema, orthopnea, palpitations, paroxysmal nocturnal dyspnea or shortness of breath Dermatological: negative for rash Respiratory: negative for cough or wheezing Urologic: negative for hematuria Abdominal: negative for nausea, vomiting, diarrhea, bright red blood per rectum, melena, or hematemesis Neurologic: negative for visual changes, syncope, or dizziness All other systems reviewed and are otherwise negative except as noted above.    Blood pressure 116/76, pulse 65, height 6' (1.829 m), weight 157 lb 3.2 oz (71.305 kg).  General appearance: alert and no distress Neck: no adenopathy, no carotid bruit, no JVD, supple, symmetrical, trachea midline and thyroid not enlarged, symmetric, no tenderness/mass/nodules Lungs: clear to auscultation  bilaterally Heart: regular rate and rhythm, S1, S2 normal, no murmur, click, rub or gallop Extremities: extremities normal, atraumatic, no cyanosis or edema  EKG /65 without ST or T-wave changes. I personally reviewed this EKG  ASSESSMENT AND PLAN:   Hyperlipidemia History of hyperlipidemia on statin therapy. We will  recheck a lipid liver profile  Essential hypertension Hypertension blood pressure measures at 116/76. He is on lisinopril. Continue current meds at current dosing  STEMI (ST elevation myocardial infarction), ST elevation in inf. wall, s/p DES resolute to RCA 11/21/11 History of CAD status post inferior wall Monocryl infarction 11/21/11 with intervention of his distal RCA using a drug-eluting stent. He was brought back several days later and had staged intervention of his circumflex and diagonal branch. He denies chest pain or shortness of breath.      Lorretta Harp MD FACP,FACC,FAHA, [facility] 09/13/2015 4:06 PM

## 2015-09-13 NOTE — Assessment & Plan Note (Signed)
Hypertension blood pressure measures at 116/76. He is on lisinopril. Continue current meds at current dosing

## 2015-09-13 NOTE — Assessment & Plan Note (Signed)
History of hyperlipidemia on statin therapy.  We will recheck a lipid liver profile 

## 2015-09-13 NOTE — Patient Instructions (Signed)
Medication Instructions:  Your physician recommends that you continue on your current medications as directed. Please refer to the Current Medication list given to you today.   Labwork: Your physician recommends that you return for lab work in: FASTING (lipid/liver) The lab can be found on the FIRST FLOOR of out building in [address]   Testing/Procedures: none  Follow-Up: Your physician wants you to follow-up in: 12 months with Dr. Berry. You will receive a reminder letter in the mail two months in advance. If you don't receive a letter, please call our office to schedule the follow-up appointment.   Any Other Special Instructions Will Be Listed Below (If Applicable).     If you need a refill on your cardiac medications before your next appointment, please call your pharmacy.   

## 2015-09-13 NOTE — Assessment & Plan Note (Signed)
History of CAD status post inferior wall Monocryl infarction 11/21/11 with intervention of his distal RCA using a drug-eluting stent. He was brought back several days later and had staged intervention of his circumflex and diagonal branch. He denies chest pain or shortness of breath.

## 2015-09-18 ENCOUNTER — Other Ambulatory Visit: Payer: Self-pay | Admitting: Cardiovascular Disease

## 2015-09-18 NOTE — Telephone Encounter (Signed)
REFILL 

## 2015-09-21 ENCOUNTER — Other Ambulatory Visit: Payer: Self-pay | Admitting: Adult Health

## 2015-09-26 ENCOUNTER — Other Ambulatory Visit (INDEPENDENT_AMBULATORY_CARE_PROVIDER_SITE_OTHER): Payer: Medicare Other

## 2015-09-26 DIAGNOSIS — Z0000 Encounter for general adult medical examination without abnormal findings: Secondary | ICD-10-CM | POA: Diagnosis not present

## 2015-09-26 DIAGNOSIS — Z125 Encounter for screening for malignant neoplasm of prostate: Secondary | ICD-10-CM | POA: Diagnosis not present

## 2015-09-26 LAB — LIPID PANEL
Cholesterol: 129 mg/dL (ref 0–200)
HDL: 58.7 mg/dL (ref >39.00)
LDL Cholesterol: 52 mg/dL (ref 0–99)
NonHDL: 70.48
Total CHOL/HDL Ratio: 2
Triglycerides: 94 mg/dL (ref 0.0–149.0)
VLDL: 18.8 mg/dL (ref 0.0–40.0)

## 2015-09-26 LAB — CBC WITH DIFFERENTIAL/PLATELET
Basophils Absolute: 0 10*3/uL (ref 0.0–0.1)
Basophils Relative: 0.7 % (ref 0.0–3.0)
Eosinophils Absolute: 0.2 10*3/uL (ref 0.0–0.7)
Eosinophils Relative: 3 % (ref 0.0–5.0)
HCT: 42 % (ref 39.0–52.0)
Hemoglobin: 14.1 g/dL (ref 13.0–17.0)
Lymphocytes Relative: 34.6 % (ref 12.0–46.0)
Lymphs Abs: 2.5 10*3/uL (ref 0.7–4.0)
MCHC: 33.5 g/dL (ref 30.0–36.0)
MCV: 90.1 fl (ref 78.0–100.0)
Monocytes Absolute: 0.7 10*3/uL (ref 0.1–1.0)
Monocytes Relative: 9.3 % (ref 3.0–12.0)
Neutro Abs: 3.7 10*3/uL (ref 1.4–7.7)
Neutrophils Relative %: 52.4 % (ref 43.0–77.0)
Platelets: 196 10*3/uL (ref 150.0–400.0)
RBC: 4.66 Mil/uL (ref 4.22–5.81)
RDW: 13.4 % (ref 11.5–15.5)
WBC: 7.1 10*3/uL (ref 4.0–10.5)

## 2015-09-26 LAB — BASIC METABOLIC PANEL
BUN: 19 mg/dL (ref 6–23)
CO2: 31 mEq/L (ref 19–32)
Calcium: 9 mg/dL (ref 8.4–10.5)
Chloride: 103 mEq/L (ref 96–112)
Creatinine, Ser: 0.76 mg/dL (ref 0.40–1.50)
GFR: 109.26 mL/min (ref >60.00)
Glucose, Bld: 99 mg/dL (ref 70–99)
Potassium: 4.1 mEq/L (ref 3.5–5.1)
Sodium: 139 mEq/L (ref 135–145)

## 2015-09-26 LAB — HEPATIC FUNCTION PANEL
ALT: 28 U/L (ref 0–53)
AST: 23 U/L (ref 0–37)
Albumin: 4.2 g/dL (ref 3.5–5.2)
Alkaline Phosphatase: 48 U/L (ref 39–117)
Bilirubin, Direct: 0.3 mg/dL (ref 0.0–0.3)
Total Bilirubin: 1.6 mg/dL — ABNORMAL HIGH (ref 0.2–1.2)
Total Protein: 6.3 g/dL (ref 6.0–8.3)

## 2015-09-26 LAB — POC URINALSYSI DIPSTICK (AUTOMATED)
Bilirubin, UA: NEGATIVE
Glucose, UA: NEGATIVE
Ketones, UA: NEGATIVE
Leukocytes, UA: NEGATIVE
Nitrite, UA: NEGATIVE
Spec Grav, UA: 1.02
Urobilinogen, UA: 0.2
pH, UA: 6.5

## 2015-09-26 LAB — PSA: PSA: 4.39 ng/mL — ABNORMAL HIGH (ref 0.10–4.00)

## 2015-09-26 LAB — TSH: TSH: 0.87 u[IU]/mL (ref 0.35–4.50)

## 2015-09-27 ENCOUNTER — Encounter: Payer: Self-pay | Admitting: Gastroenterology

## 2015-09-27 ENCOUNTER — Telehealth: Payer: Self-pay | Admitting: *Deleted

## 2015-09-27 ENCOUNTER — Ambulatory Visit (INDEPENDENT_AMBULATORY_CARE_PROVIDER_SITE_OTHER): Payer: Medicare Other | Admitting: Gastroenterology

## 2015-09-27 VITALS — BP 110/60 | HR 62 | Ht 70.0 in | Wt 161.0 lb

## 2015-09-27 DIAGNOSIS — Z86010 Personal history of colonic polyps: Secondary | ICD-10-CM

## 2015-09-27 DIAGNOSIS — Z7901 Long term (current) use of anticoagulants: Secondary | ICD-10-CM | POA: Diagnosis not present

## 2015-09-27 MED ORDER — NA SULFATE-K SULFATE-MG SULF 17.5-3.13-1.6 GM/177ML PO SOLN: ORAL | Status: DC

## 2015-09-27 NOTE — Progress Notes (Signed)
    History of Present Illness: This is a 65 year old referred by Blake Peng, NP for the evaluation of the personal history of adenomatous colon polyps. He is maintained on Plavix for CAD with a coronary stent and prior MI in 2013. Recent office visit by Dr. Gwenlyn Found, his cardiologist, was reviewed. He is stable from a cardiac standpoint. Colonoscopy performed in September 2006 showed 2 small adenomatous colon polyps and internal hemorrhoids. He was recommended to undergo colonoscopy in September 2011 but he did not return. He has no gastrointestinal complaints. Denies weight loss, abdominal pain, constipation, diarrhea, change in stool caliber, melena, hematochezia, nausea, vomiting, dysphagia, reflux symptoms, chest pain.  Review of Systems: Pertinent positive and negative review of systems were noted in the above HPI section. All other review of systems were otherwise negative.  Current Medications, Allergies, Past Medical History, Past Surgical History, Family History and Social History were reviewed in [employer] record.  Physical Exam: General: Well developed, well nourished, no acute distress Head: Normocephalic and atraumatic Eyes:  sclerae anicteric, EOMI Ears: Normal auditory acuity Mouth: No deformity or lesions Neck: Supple, no masses or thyromegaly Lungs: Clear throughout to auscultation Heart: Regular rate and rhythm; no murmurs, rubs or bruits Abdomen: Soft, non tender and non distended. No masses, hepatosplenomegaly or hernias noted. Normal Bowel sounds Rectal: deferred to colonoscopy Musculoskeletal: Symmetrical with no gross deformities  Skin: No lesions on visible extremities Pulses:  Normal pulses noted Extremities: No clubbing, cyanosis, edema or deformities noted Neurological: Alert oriented x 4, grossly nonfocal Cervical Nodes:  No significant cervical adenopathy Inguinal Nodes: No significant inguinal adenopathy Psychological:  Alert and  cooperative. Normal mood and affect  Assessment and Recommendations:  1. Personal history of adenomatous colon polyps. Overdue for surveillance colonoscopy. Schedule colonoscopy. The risks (including bleeding, perforation, infection, missed lesions, medication reactions and possible hospitalization or surgery if complications occur), benefits, and alternatives to colonoscopy with possible biopsy and possible polypectomy were discussed with the patient and they consent to proceed.   2. Hold Plavix 5 days before procedure - will instruct when and how to resume after procedure. Low but real risk of cardiovascular event such as heart attack, stroke, embolism, thrombosis or ischemia/infarct of other organs off Plavix explained and need to seek urgent help if this occurs. The patient consents to proceed. Will communicate by phone or EMR with patient's prescribing provider to confirm that holding Plavix is reasonable in this case.    cc: Blake Peng, NP [address], [place] [address]

## 2015-09-27 NOTE — Telephone Encounter (Signed)
09/27/2015  RE: Pavlos Stiltner DOB: 08-06-1950 MRN: [id number]  Dear Dr Gwenlyn Found,   We have scheduled the above patient for a colonoscopy procedure. Our records show that he is on anticoagulation therapy.  Please advise as to how long the patient may come off his therapy of Plavix prior to the procedure, which is scheduled for 10/26/15. Please route your response to Dixon Boos, CMA.  Sincerely,  Dixon Boos

## 2015-09-27 NOTE — Patient Instructions (Signed)
You have been scheduled for a colonoscopy. Please follow written instructions given to you at your visit today.  Please pick up your prep supplies at the pharmacy within the next 1-3 days. If you use inhalers (even only as needed), please bring them with you on the day of your procedure. Your physician has requested that you go to www.startemmi.com and enter the access code given to you at your visit today. This web site gives a general overview about your procedure. However, you should still follow specific instructions given to you by our office regarding your preparation for the procedure.  You will be contacted by our office prior to your procedure for directions on holding your Plavix.  If you do not hear from our office 1 week prior to your scheduled procedure, please call [phone] to discuss.   If you are age 66 or older, your body mass index should be between 23-30. Your Body mass index is 23.1 kg/(m^2). If this is out of the aforementioned range listed, please consider follow up with your Primary Care Provider.  If you are age 6 or younger, your body mass index should be between 19-25. Your Body mass index is 23.1 kg/(m^2). If this is out of the aformentioned range listed, please consider follow up with your Primary Care Provider.

## 2015-09-30 NOTE — Telephone Encounter (Signed)
OK to interrupt anti-platelet Rx for colonoscopy 

## 2015-10-01 ENCOUNTER — Ambulatory Visit (INDEPENDENT_AMBULATORY_CARE_PROVIDER_SITE_OTHER): Payer: Medicare Other | Admitting: Physician Assistant

## 2015-10-01 VITALS — BP 116/70 | HR 60 | Temp 98.4°F | Resp 18 | Wt 160.0 lb

## 2015-10-01 DIAGNOSIS — R05 Cough: Secondary | ICD-10-CM | POA: Diagnosis not present

## 2015-10-01 DIAGNOSIS — J309 Allergic rhinitis, unspecified: Secondary | ICD-10-CM | POA: Diagnosis not present

## 2015-10-01 DIAGNOSIS — J029 Acute pharyngitis, unspecified: Secondary | ICD-10-CM

## 2015-10-01 DIAGNOSIS — R059 Cough, unspecified: Secondary | ICD-10-CM

## 2015-10-01 MED ORDER — BENZONATATE 100 MG PO CAPS: 100.0000 mg | ORAL_CAPSULE | Freq: Three times a day (TID) | ORAL | Status: DC | PRN

## 2015-10-01 MED ORDER — IPRATROPIUM BROMIDE 0.03 % NA SOLN: 2.0000 | Freq: Two times a day (BID) | NASAL | Status: DC

## 2015-10-01 MED ORDER — MAGIC MOUTHWASH W/LIDOCAINE: 10.0000 mL | ORAL | Status: DC | PRN

## 2015-10-01 NOTE — Progress Notes (Signed)
Patient ID: Blake Evans, male    DOB: [birth year], 65 y.o.   MRN: [id number]  PCP: Dorothyann Peng, NP  Subjective:   Chief Complaint  Patient presents with  . Sore Throat  . Allergies    HPI Presents for evaluation of 3-4 day history of sore throat, cough, and allergies.  Patient states his cough is non productive. Swallowing makes his sore throat worse. He has tried Alka- Marketing executive plus and Aleve with little relief. He does have clear rhinorrhea. Endorses watery, itching eyes. Denies any fever, chill, otalgia, sob, cp, N/V/D, abd pain.   He state his allergies have been worse than normal this year. He takes Claritin daily.  He did babysit his grandchild last week who was sick with a "cold". No other sick contacts.     Review of Systems Constitutional: Negative for fever, chills and fatigue.  HENT: Positive for congestion, postnasal drip, rhinorrhea, sneezing and sore throat. Negative for ear pain and sinus pressure.  Eyes: Positive for discharge, redness and itching.  Respiratory: Positive for cough. Negative for shortness of breath and wheezing.  Cardiovascular: Negative for chest pain.  Gastrointestinal: Negative for nausea, vomiting, abdominal pain and diarrhea.  Musculoskeletal: Negative for myalgias.  Allergic/Immunologic: Positive for environmental allergies.  Neurological: Negative for dizziness, syncope, weakness and headaches.     Patient Active Problem List   Diagnosis Date Noted  . Essential hypertension 08/03/2015  . Hearing loss 03/17/2012  . S/P angioplasty with stent, to LAD/Diag and AV groove, LCX.  11/24/11 11/25/2011  . Hyperlipidemia 11/25/2011  . STEMI (ST elevation myocardial infarction), ST elevation in inf. wall, s/p DES resolute to RCA 11/21/11 11/21/2011  . BPH (benign prostatic hyperplasia) 11/21/2011  . CAD (coronary artery disease), Residual disease of LAD and LCX 11/21/2011     Prior to Admission medications   Medication Sig Start  Date End Date Taking? Authorizing Provider  aspirin 81 MG tablet Take 81 mg by mouth 2 (two) times daily.    Yes Historical Provider, MD  atorvastatin (LIPITOR) 40 MG tablet TAKE 1 TABLET BY MOUTH  DAILY AT 6 PM 07/02/15  Yes Lorretta Harp, MD  clopidogrel (PLAVIX) 75 MG tablet Take 1 tablet (75 mg total) by mouth daily. NEED OV. 09/04/15  Yes Lorretta Harp, MD  finasteride (PROSCAR) 5 MG tablet Take 1 tablet by mouth daily. 02/09/14  Yes Historical Provider, MD  lisinopril (PRINIVIL,ZESTRIL) 2.5 MG tablet Take 1 tablet by mouth  daily 09/18/15  Yes Lorretta Harp, MD  Multiple Vitamin (MULTIVITAMIN) tablet Take 1 tablet by mouth daily.   Yes Historical Provider, MD  Na Sulfate-K Sulfate-Mg Sulf SOLN Suprep-use as directed 09/27/15  Yes Ladene Artist, MD  nitroGLYCERIN (NITROSTAT) 0.4 MG SL tablet Place 1 tablet (0.4 mg  total) under the tongue  every 5 (five) minutes x 3  doses as needed for chest  pain. 09/24/15  Yes Cory Nafziger, NP  NITROSTAT 0.4 MG SL tablet PLACE 1 TABLET UNDER THE TONGUE EVERY 5 MINUTES FOR 3 DOSES AS NEEDED FOR CHEST PAIN 08/08/15  Yes Dorothyann Peng, NP  saw palmetto 80 MG capsule Take 80 mg by mouth daily.   Yes Historical Provider, MD     No Known Allergies     Objective:  Physical Exam  Constitutional: He is oriented to person, place, and time. He appears well-developed and well-nourished. He is active and cooperative. No distress.  BP 116/70 mmHg  Pulse 60  Temp(Src) 98.4 F (36.9 C) (  Oral)  Resp 18  Wt 160 lb (72.576 kg)  SpO2 97%  HENT:  Head: Normocephalic and atraumatic.  Right Ear: Hearing, tympanic membrane, external ear and ear canal normal.  Left Ear: Hearing, tympanic membrane, external ear and ear canal normal.  Nose: Rhinorrhea present. No mucosal edema.  Mouth/Throat: Oropharynx is clear and moist and mucous membranes are normal. No oral lesions. No uvula swelling.  Nasal turbinates are pale  Eyes: EOM and lids are normal. Pupils are equal,  round, and reactive to light. Right conjunctiva is injected. Left conjunctiva is injected. No scleral icterus.  Neck: Normal range of motion, full passive range of motion without pain and phonation normal. Neck supple. No thyromegaly present.  Cardiovascular: Normal rate, regular rhythm and normal heart sounds.   Pulses:      Radial pulses are 2+ on the right side, and 2+ on the left side.  Pulmonary/Chest: Effort normal and breath sounds normal.  Lymphadenopathy:       Head (right side): No tonsillar, no preauricular, no posterior auricular and no occipital adenopathy present.       Head (left side): No tonsillar, no preauricular, no posterior auricular and no occipital adenopathy present.    He has no cervical adenopathy.       Right: No supraclavicular adenopathy present.       Left: No supraclavicular adenopathy present.  Neurological: He is alert and oriented to person, place, and time. No sensory deficit.  Skin: Skin is warm, dry and intact. No rash noted. No cyanosis or erythema. Nails show no clubbing.  Psychiatric: He has a normal mood and affect. His speech is normal and behavior is normal.           Assessment & Plan:   1. Allergic rhinitis, unspecified allergic rhinitis type Likely allergic, cannot exclude viral illness. Supportive care.  Anticipatory guidance.  RTC if symptoms worsen/persist. - ipratropium (ATROVENT) 0.03 % nasal spray; Place 2 sprays into both nostrils 2 (two) times daily.  Dispense: 30 mL; Refill: 0  2. Cough 3. Sore throat Likely due to post-nasal drainage. As above. - benzonatate (TESSALON) 100 MG capsule; Take 1-2 capsules (100-200 mg total) by mouth 3 (three) times daily as needed for cough.  Dispense: 40 capsule; Refill: 0 - magic mouthwash w/lidocaine SOLN; Take 10 mLs by mouth every 2 (two) hours as needed for mouth pain.  Dispense: 360 mL; Refill: 0   Fara Chute, PA-C Physician Assistant-Certified [facility]

## 2015-10-01 NOTE — Patient Instructions (Addendum)
Get plenty of rest and drink at least 64 ounces of water a day  May take OTC Claritin, Zyrtec, or Allegra for allergy relief.   Take medication as prescribed for symptomatic relief.      IF you received an x-ray today, you will receive an invoice from [facility]. Please contact [facility] at [phone] with questions or concerns regarding your invoice.   IF you received labwork today, you will receive an invoice from [employer]. Please contact [facility] at [phone] with questions or concerns regarding your invoice.   Our billing staff will not be able to assist you with questions regarding bills from these companies.  You will be contacted with the lab results as soon as they are available. The fastest way to get your results is to activate your My Chart account. Instructions are located on the last page of this paperwork. If you have not heard from [place] regarding the results in 2 weeks, please contact this office.

## 2015-10-01 NOTE — Progress Notes (Signed)
Subjective:    Patient ID: Blake Evans, male    DOB: 06-Apr-1951, 65 y.o.   MRN: [id number]  Chief Complaint  Patient presents with  . Sore Throat  . Allergies    HPI  Patient presents with history of MI, HTN, hyperlipidemia, and CAD for 3-4 day history of sore throat, cough, and allergies.  Patient states his cough is non productive. Swallowing makes his sore throat worse. He has tried Alka- Marketing executive plus and Aleve with little relief. He does have clear rhinorrhea. Endorses watery, itching eyes. Denies any fever, chill, otalgia, sob, cp, N/V/D, abd pain.   He state his allergies have been worse than normal this year. He takes Claritin daily.  He did babysit his grandchild last week who was sick with a "cold". No other sick contacts.  Past Medical History  Diagnosis Date  . STEMI (ST elevation myocardial infarction), ST elevation in inf. wall 11/21/2011  . BPH (benign prostatic hyperplasia) 11/21/2011  . CAD (coronary artery disease), Residual disease of LAD and LCX 11/21/2011    myoview 01/09/12-positive bruce treadmill test with ventricular bigemeny, 2-3 mm horizontal ST segment depressions in II,III, AVF at peak exercise  . Kidney stone   . S/P angioplasty with stent, to LAD/Diag and AV groove, LCX.  11/24/11 11/25/2011  . Hyperlipidemia 11/25/2011  . Adenomatous colon polyp 2006   Current Outpatient Prescriptions on File Prior to Visit  Medication Sig Dispense Refill  . aspirin 81 MG tablet Take 81 mg by mouth 2 (two) times daily.     Marland Kitchen atorvastatin (LIPITOR) 40 MG tablet TAKE 1 TABLET BY MOUTH  DAILY AT 6 PM 90 tablet 0  . clopidogrel (PLAVIX) 75 MG tablet Take 1 tablet (75 mg total) by mouth daily. NEED OV. 90 tablet 0  . finasteride (PROSCAR) 5 MG tablet Take 1 tablet by mouth daily.    Marland Kitchen lisinopril (PRINIVIL,ZESTRIL) 2.5 MG tablet Take 1 tablet by mouth  daily 90 tablet 3  . Multiple Vitamin (MULTIVITAMIN) tablet Take 1 tablet by mouth daily.    . Na Sulfate-K Sulfate-Mg  Sulf SOLN Suprep-use as directed 354 mL 0  . nitroGLYCERIN (NITROSTAT) 0.4 MG SL tablet Place 1 tablet (0.4 mg  total) under the tongue  every 5 (five) minutes x 3  doses as needed for chest  pain. 100 tablet 0  . NITROSTAT 0.4 MG SL tablet PLACE 1 TABLET UNDER THE TONGUE EVERY 5 MINUTES FOR 3 DOSES AS NEEDED FOR CHEST PAIN 25 tablet 1  . saw palmetto 80 MG capsule Take 80 mg by mouth daily.     No current facility-administered medications on file prior to visit.   No Known Allergies      Review of Systems  Constitutional: Negative for fever, chills and fatigue.  HENT: Positive for congestion, postnasal drip, rhinorrhea, sneezing and sore throat. Negative for ear pain and sinus pressure.   Eyes: Positive for discharge, redness and itching.  Respiratory: Positive for cough. Negative for shortness of breath and wheezing.   Cardiovascular: Negative for chest pain.  Gastrointestinal: Negative for nausea, vomiting, abdominal pain and diarrhea.  Musculoskeletal: Negative for myalgias.  Allergic/Immunologic: Positive for environmental allergies.  Neurological: Negative for dizziness, syncope, weakness and headaches.       Objective:   Physical Exam  Constitutional: He is oriented to person, place, and time. He appears well-developed and well-nourished.  HENT:  Head: Normocephalic and atraumatic.  Right Ear: Tympanic membrane, external ear and ear canal normal.  Left  Ear: Tympanic membrane, external ear and ear canal normal.  Nose: Rhinorrhea present. No mucosal edema (Turbinates pale). Right sinus exhibits no maxillary sinus tenderness and no frontal sinus tenderness. Left sinus exhibits no maxillary sinus tenderness and no frontal sinus tenderness.  Mouth/Throat: Oropharynx is clear and moist and mucous membranes are normal. No oropharyngeal exudate, posterior oropharyngeal edema or posterior oropharyngeal erythema.  Eyes: Pupils are equal, round, and reactive to light. Right conjunctiva  is injected. Left conjunctiva is injected.  Neck: Normal range of motion. Neck supple. No thyromegaly present.  Cardiovascular: Normal rate, regular rhythm, normal heart sounds and intact distal pulses.   Pulmonary/Chest: Effort normal and breath sounds normal.  Lymphadenopathy:    He has no cervical adenopathy.  Neurological: He is alert and oriented to person, place, and time.  Skin: Skin is warm and dry.  Psychiatric: He has a normal mood and affect. His behavior is normal. Judgment and thought content normal.          Assessment & Plan:  1. Allergic rhinitis, unspecified allergic rhinitis type Continue OTC Claritin.  - ipratropium (ATROVENT) 0.03 % nasal spray; Place 2 sprays into both nostrils 2 (two) times daily.  Dispense: 30 mL; Refill: 0  2. Cough Non productive cough. Lungs CTAB. Likely due to post nasal drip.  - benzonatate (TESSALON) 100 MG capsule; Take 1-2 capsules (100-200 mg total) by mouth 3 (three) times daily as needed for cough.  Dispense: 40 capsule; Refill: 0  3. Sore throat Likely due to postnasal drip from allergic rhinitis. - magic mouthwash w/lidocaine SOLN; Take 10 mLs by mouth every 2 (two) hours as needed for mouth pain.  Dispense: 360 mL; Refill: 0  Melina Schools PA-S 10/01/2015

## 2015-10-01 NOTE — Telephone Encounter (Signed)
Patient advised that per Dr Gwenlyn Found, he may discontinue Plavix 5 days prior to colonoscopy. Patient verbalizes understanding.

## 2015-10-03 ENCOUNTER — Telehealth: Payer: Self-pay | Admitting: Adult Health

## 2015-10-03 NOTE — Telephone Encounter (Signed)
Ok to see tomorrow

## 2015-10-03 NOTE — Telephone Encounter (Signed)
Pt has CPE tomorrow and states he has had a sinus/upper respiratory  issue going on about 5 days.  Pt is getting better, but not well. Would like to know if he should keep this cpe tomorrow, or reschedule for next week? Pt will need to be seen within 30 days of his labs in order for his insurance to pay for his labs. (labs done 4/19) If you prefer pt to wait until next week, is it ok to work him in at another slot?

## 2015-10-03 NOTE — Telephone Encounter (Signed)
Please advise 

## 2015-10-04 ENCOUNTER — Other Ambulatory Visit: Payer: Self-pay | Admitting: Cardiovascular Disease

## 2015-10-04 ENCOUNTER — Ambulatory Visit (INDEPENDENT_AMBULATORY_CARE_PROVIDER_SITE_OTHER): Payer: Medicare Other | Admitting: Adult Health

## 2015-10-04 ENCOUNTER — Encounter: Payer: Self-pay | Admitting: Adult Health

## 2015-10-04 VITALS — BP 140/64 | Temp 98.3°F | Ht 70.0 in | Wt 159.7 lb

## 2015-10-04 DIAGNOSIS — J0140 Acute pansinusitis, unspecified: Secondary | ICD-10-CM

## 2015-10-04 DIAGNOSIS — N400 Enlarged prostate without lower urinary tract symptoms: Secondary | ICD-10-CM | POA: Diagnosis not present

## 2015-10-04 DIAGNOSIS — Z0001 Encounter for general adult medical examination with abnormal findings: Secondary | ICD-10-CM | POA: Diagnosis not present

## 2015-10-04 DIAGNOSIS — H9193 Unspecified hearing loss, bilateral: Secondary | ICD-10-CM

## 2015-10-04 DIAGNOSIS — E785 Hyperlipidemia, unspecified: Secondary | ICD-10-CM

## 2015-10-04 DIAGNOSIS — I10 Essential (primary) hypertension: Secondary | ICD-10-CM

## 2015-10-04 DIAGNOSIS — Z0000 Encounter for general adult medical examination without abnormal findings: Secondary | ICD-10-CM

## 2015-10-04 MED ORDER — DOXYCYCLINE HYCLATE 100 MG PO CAPS: 100.0000 mg | ORAL_CAPSULE | Freq: Two times a day (BID) | ORAL | Status: DC

## 2015-10-04 NOTE — Progress Notes (Signed)
Subjective:    Patient ID: Blake Evans, male    DOB: [birth year], 65 y.o.   MRN: [id number]  HPI  Patient presents for yearly preventative medicine examination. He is a 65 year old caucasian male who  has a past medical history of STEMI (ST elevation myocardial infarction), ST elevation in inf. wall (11/21/2011); BPH (benign prostatic hyperplasia) (11/21/2011); CAD (coronary artery disease), Residual disease of LAD and LCX (11/21/2011); Kidney stone; S/P angioplasty with stent, to LAD/Diag and AV groove, LCX.  11/24/11 (11/25/2011); Hyperlipidemia (11/25/2011); and Adenomatous colon polyp (2006).  Medicare questionnaire was completed  All immunizations and health maintenance protocols were reviewed with the patient and needed orders were placed.   Medication reconciliation,  past medical history, social history, problem list and allergies were reviewed in detail with the patient  Goals were established with regard to weight loss, exercise, and  diet in compliance with medications  End of life planning was discussed- he has advanced directives and a living will.   He has followed up with cardiology this month and was told to return in one year.   He has also seen his Urologist and dermatologist this year.    He does have an acute complaint of 5 days of sinusitis. He reports that he feels as though his symptoms are getting better. Currently he has sinus pain and pressure, rhinorrhea, fatigue and PND. He denies any fevers, n/v/d   Review of Systems  Constitutional: Positive for fatigue. Negative for fever, chills, diaphoresis, activity change and appetite change.  HENT: Positive for congestion, hearing loss (Chronic), postnasal drip, rhinorrhea and sinus pressure. Negative for sore throat, trouble swallowing and voice change.   Eyes: Negative.   Respiratory: Negative.   Cardiovascular: Negative.   Gastrointestinal: Negative.   Endocrine: Negative.   Genitourinary: Negative.     Musculoskeletal: Negative.   Skin: Negative.   Allergic/Immunologic: Negative.   Neurological: Negative.   Hematological: Negative.   Psychiatric/Behavioral: Negative.   All other systems reviewed and are negative.  Past Medical History  Diagnosis Date  . STEMI (ST elevation myocardial infarction), ST elevation in inf. wall 11/21/2011  . BPH (benign prostatic hyperplasia) 11/21/2011  . CAD (coronary artery disease), Residual disease of LAD and LCX 11/21/2011    myoview 01/09/12-positive bruce treadmill test with ventricular bigemeny, 2-3 mm horizontal ST segment depressions in II,III, AVF at peak exercise  . Kidney stone   . S/P angioplasty with stent, to LAD/Diag and AV groove, LCX.  11/24/11 11/25/2011  . Hyperlipidemia 11/25/2011  . Adenomatous colon polyp 2006    Social History   Social History  . Marital Status: Single    Spouse Name: N/A  . Number of Children: N/A  . Years of Education: N/A   Occupational History  . Not on file.   Social History Main Topics  . Smoking status: Never Smoker   . Smokeless tobacco: Never Used  . Alcohol Use: 0.0 oz/week    0 Standard drinks or equivalent per week     Comment: occasional  . Drug Use: No  . Sexual Activity: Yes    Birth Control/ Protection: None   Other Topics Concern  . Not on file   Social History Narrative   Retired as a Optometrist in [employer]   Married x 8 months   Two grown children - both live in [place]           Past Surgical History  Procedure Laterality Date  . No past surgeries    .  Coronary angioplasty with stent placement  11/21/11    inferior wall MI; PCI and stenting to distal RCA, Resolute DES 3x14mm  . Coronary angioplasty with stent placement  11/24/11    distal LAD diag branch PCI and stenting with a resolute DES along with mid AV groove circ PCI with a resolute DES   . Left heart catheterization with coronary angiogram N/A 11/21/2011    Procedure: LEFT HEART CATHETERIZATION WITH CORONARY  ANGIOGRAM;  Surgeon: Lorretta Harp, MD;  Location: [facility] CATH LAB;  Service: Cardiovascular;  Laterality: N/A;  . Percutaneous coronary stent intervention (pci-s)  11/21/2011    Procedure: PERCUTANEOUS CORONARY STENT INTERVENTION (PCI-S);  Surgeon: Lorretta Harp, MD;  Location[facility] C[facility] LAB;  Service: Cardiovascular;;  . Percutaneous coronary stent intervention (pci-s) N/A 11/24/2011    Procedure: PERCUTANEOUS CORONARY STENT INTERVENTION (PCI-S);  Surgeon: Lorretta Harp, MD;  Location: [facility] CATH LAB;  Service: Cardiovascular;  Laterality: N/A;    Family History  Problem Relation Age of Onset  . Coronary artery disease Father   . Heart disease Father   . Hyperlipidemia Father   . Diabetes Mother   . Diabetes Sister     No Known Allergies  Current Outpatient Prescriptions on File Prior to Visit  Medication Sig Dispense Refill  . aspirin 81 MG tablet Take 81 mg by mouth 2 (two) times daily.     Marland Kitchen atorvastatin (LIPITOR) 40 MG tablet TAKE 1 TABLET BY MOUTH  DAILY AT 6 PM 90 tablet 0  . benzonatate (TESSALON) 100 MG capsule Take 1-2 capsules (100-200 mg total) by mouth 3 (three) times daily as needed for cough. 40 capsule 0  . clopidogrel (PLAVIX) 75 MG tablet Take 1 tablet (75 mg total) by mouth daily. NEED OV. 90 tablet 0  . finasteride (PROSCAR) 5 MG tablet Take 1 tablet by mouth daily.    Marland Kitchen ipratropium (ATROVENT) 0.03 % nasal spray Place 2 sprays into both nostrils 2 (two) times daily. 30 mL 0  . lisinopril (PRINIVIL,ZESTRIL) 2.5 MG tablet Take 1 tablet by mouth  daily 90 tablet 3  . magic mouthwash w/lidocaine SOLN Take 10 mLs by mouth every 2 (two) hours as needed for mouth pain. 360 mL 0  . Multiple Vitamin (MULTIVITAMIN) tablet Take 1 tablet by mouth daily.    . Na Sulfate-K Sulfate-Mg Sulf SOLN Suprep-use as directed 354 mL 0  . nitroGLYCERIN (NITROSTAT) 0.4 MG SL tablet Place 1 tablet (0.4 mg  total) under the tongue  every 5 (five) minutes x 3  doses as needed for chest  pain. 100  tablet 0  . NITROSTAT 0.4 MG SL tablet PLACE 1 TABLET UNDER THE TONGUE EVERY 5 MINUTES FOR 3 DOSES AS NEEDED FOR CHEST PAIN 25 tablet 1  . saw palmetto 80 MG capsule Take 80 mg by mouth daily.     No current facility-administered medications on file prior to visit.    BP 140/64 mmHg  Temp(Src) 98.3 F (36.8 C) (Oral)  Ht 5\' 10"  (1.778 m)  Wt 159 lb 11.2 oz (72.439 kg)  BMI 22.91 kg/m2       Objective:   Physical Exam  Constitutional: He is oriented to person, place, and time. He appears well-developed and well-nourished. No distress.  HENT:  Head: Normocephalic and atraumatic.  Right Ear: Hearing, tympanic membrane, external ear and ear canal normal.  Left Ear: Hearing, tympanic membrane, external ear and ear canal normal.  Nose: Rhinorrhea present. No mucosal edema. Right sinus exhibits maxillary  sinus tenderness and frontal sinus tenderness. Left sinus exhibits maxillary sinus tenderness and frontal sinus tenderness.  Mouth/Throat: Oropharynx is clear and moist and mucous membranes are normal. No oropharyngeal exudate.  Eyes: Conjunctivae and EOM are normal. Pupils are equal, round, and reactive to light. Right eye exhibits no discharge. Left eye exhibits no discharge. No scleral icterus.  Neck: Normal range of motion. Neck supple. No JVD present. No tracheal deviation present. No thyromegaly present.  Cardiovascular: Normal rate, regular rhythm, normal heart sounds and intact distal pulses.  Exam reveals no gallop.   No murmur heard. Pulmonary/Chest: Effort normal and breath sounds normal. No stridor. No respiratory distress. He has no wheezes. He has no rales. He exhibits no tenderness.  Abdominal: Soft. Bowel sounds are normal. He exhibits no distension and no mass. There is no tenderness. There is no rebound and no guarding.  Genitourinary:  Refused due to seeing Urology   Musculoskeletal: Normal range of motion. He exhibits no edema or tenderness.  Lymphadenopathy:    He  has no cervical adenopathy.  Neurological: He is alert and oriented to person, place, and time. He has normal reflexes. He displays normal reflexes. No cranial nerve deficit. He exhibits normal muscle tone. Coordination normal.  Skin: Skin is warm and dry. No rash noted. He is not diaphoretic. No erythema. No pallor.  Has scattered moles and cherry angiomas, throughout the body.   Psychiatric: He has a normal mood and affect. His behavior is normal. Judgment and thought content normal.  Nursing note and vitals reviewed.     Assessment & Plan:  1. Routine general medical examination at a health care facility - Reviewed labs in detail with patient.  - Reviewed most recent cardiology notes.  - Follow up in one year or sooner if needed - Continue to stay active and eat healthy  2. Essential hypertension - Controlled with lisinopril.  - He was elevated today but he is also using OTC could medication.  - Monitor at home  3. BPH (benign prostatic hyperplasia) - Followed by Urology   4. Hard of hearing, bilateral  - Ambulatory referral to Audiology  5. Hyperlipidemia - Controlled with Lipitor   6. Acute pansinusitis, recurrence not specified - Appears to be viral. Will send in prescription and if he does not feel any better in the next 3 days he can start the antibiotic - Stay hydrated  - Flonase or claritin - D/c OTC cold medications.  - doxycycline (VIBRAMYCIN) 100 MG capsule; Take 1 capsule (100 mg total) by mouth 2 (two) times daily.  Dispense: 14 capsule; Refill: 0 - Follow up as needed  Dorothyann Peng, NP

## 2015-10-04 NOTE — Patient Instructions (Addendum)
It was great seeing you today!  Your exam was great and your blood work looks wonderful.   I have sent in a prescription for Doxycycline to the pharmacy. You can start this tomorrow if you are not feeling any better.   Follow up with me in one year or sooner if needed  Health Maintenance, Male A healthy lifestyle and preventative care can promote health and wellness.  Maintain regular health, dental, and eye exams.  Eat a healthy diet. Foods like vegetables, fruits, whole grains, low-fat dairy products, and lean protein foods contain the nutrients you need and are low in calories. Decrease your intake of foods high in solid fats, added sugars, and salt. Get information about a proper diet from your health care provider, if necessary.  Regular physical exercise is one of the most important things you can do for your health. Most adults should get at least 150 minutes of moderate-intensity exercise (any activity that increases your heart rate and causes you to sweat) each week. In addition, most adults need muscle-strengthening exercises on 2 or more days a week.   Maintain a healthy weight. The body mass index (BMI) is a screening tool to identify possible weight problems. It provides an estimate of body fat based on height and weight. Your health care provider can find your BMI and can help you achieve or maintain a healthy weight. For males 20 years and older:  A BMI below 18.5 is considered underweight.  A BMI of 18.5 to 24.9 is normal.  A BMI of 25 to 29.9 is considered overweight.  A BMI of 30 and above is considered obese.  Maintain normal blood lipids and cholesterol by exercising and minimizing your intake of saturated fat. Eat a balanced diet with plenty of fruits and vegetables. Blood tests for lipids and cholesterol should begin at age 73 and be repeated every 5 years. If your lipid or cholesterol levels are high, you are over age 36, or you are at high risk for heart disease,  you may need your cholesterol levels checked more frequently.Ongoing high lipid and cholesterol levels should be treated with medicines if diet and exercise are not working.  If you smoke, find out from your health care provider how to quit. If you do not use tobacco, do not start.  Lung cancer screening is recommended for adults aged 78-80 years who are at high risk for developing lung cancer because of a history of smoking. A yearly low-dose CT scan of the lungs is recommended for people who have at least a 30-pack-year history of smoking and are current smokers or have quit within the past 15 years. A pack year of smoking is smoking an average of 1 pack of cigarettes a day for 1 year (for example, a 30-pack-year history of smoking could mean smoking 1 pack a day for 30 years or 2 packs a day for 15 years). Yearly screening should continue until the smoker has stopped smoking for at least 15 years. Yearly screening should be stopped for people who develop a health problem that would prevent them from having lung cancer treatment.  If you choose to drink alcohol, do not have more than 2 drinks per day. One drink is considered to be 12 oz (360 mL) of beer, 5 oz (150 mL) of wine, or 1.5 oz (45 mL) of liquor.  Avoid the use of street drugs. Do not share needles with anyone. Ask for help if you need support or instructions about stopping  the use of drugs.  High blood pressure causes heart disease and increases the risk of stroke. High blood pressure is more likely to develop in:  People who have blood pressure in the end of the normal range (100-139/85-89 mm Hg).  People who are overweight or obese.  People who are African American.  If you are 64-45 years of age, have your blood pressure checked every 3-5 years. If you are 50 years of age or older, have your blood pressure checked every year. You should have your blood pressure measured twice--once when you are at a hospital or clinic, and once when  you are not at a hospital or clinic. Record the average of the two measurements. To check your blood pressure when you are not at a hospital or clinic, you can use:  An automated blood pressure machine at a pharmacy.  A home blood pressure monitor.  If you are 7-53 years old, ask your health care provider if you should take aspirin to prevent heart disease.  Diabetes screening involves taking a blood sample to check your fasting blood sugar level. This should be done once every 3 years after age 71 if you are at a normal weight and without risk factors for diabetes. Testing should be considered at a younger age or be carried out more frequently if you are overweight and have at least 1 risk factor for diabetes.  Colorectal cancer can be detected and often prevented. Most routine colorectal cancer screening begins at the age of 85 and continues through age 42. However, your health care provider may recommend screening at an earlier age if you have risk factors for colon cancer. On a yearly basis, your health care provider may provide home test kits to check for hidden blood in the stool. A small camera at the end of a tube may be used to directly examine the colon (sigmoidoscopy or colonoscopy) to detect the earliest forms of colorectal cancer. Talk to your health care provider about this at age 70 when routine screening begins. A direct exam of the colon should be repeated every 5-10 years through age 67, unless early forms of precancerous polyps or small growths are found.  People who are at an increased risk for hepatitis B should be screened for this virus. You are considered at high risk for hepatitis B if:  You were born in a country where hepatitis B occurs often. Talk with your health care provider about which countries are considered high risk.  Your parents were born in a high-risk country and you have not received a shot to protect against hepatitis B (hepatitis B vaccine).  You have HIV  or AIDS.  You use needles to inject street drugs.  You live with, or have sex with, someone who has hepatitis B.  You are a man who has sex with other men (MSM).  You get hemodialysis treatment.  You take certain medicines for conditions like cancer, organ transplantation, and autoimmune conditions.  Hepatitis C blood testing is recommended for all people born from [birth year] through 1966 and any individual with known risk factors for hepatitis C.  Healthy men should no longer receive prostate-specific antigen (PSA) blood tests as part of routine cancer screening. Talk to your health care provider about prostate cancer screening.  Testicular cancer screening is not recommended for adolescents or adult males who have no symptoms. Screening includes self-exam, a health care provider exam, and other screening tests. Consult with your health care provider about any  symptoms you have or any concerns you have about testicular cancer.  Practice safe sex. Use condoms and avoid high-risk sexual practices to reduce the spread of sexually transmitted infections (STIs).  You should be screened for STIs, including gonorrhea and chlamydia if:  You are sexually active and are younger than 24 years.  You are older than 24 years, and your health care provider tells you that you are at risk for this type of infection.  Your sexual activity has changed since you were last screened, and you are at an increased risk for chlamydia or gonorrhea. Ask your health care provider if you are at risk.  If you are at risk of being infected with HIV, it is recommended that you take a prescription medicine daily to prevent HIV infection. This is called pre-exposure prophylaxis (PrEP). You are considered at risk if:  You are a man who has sex with other men (MSM).  You are a heterosexual man who is sexually active with multiple partners.  You take drugs by injection.  You are sexually active with a partner who has  HIV.  Talk with your health care provider about whether you are at high risk of being infected with HIV. If you choose to begin PrEP, you should first be tested for HIV. You should then be tested every 3 months for as long as you are taking PrEP.  Use sunscreen. Apply sunscreen liberally and repeatedly throughout the day. You should seek shade when your shadow is shorter than you. Protect yourself by wearing long sleeves, pants, a wide-brimmed hat, and sunglasses year round whenever you are outdoors.  Tell your health care provider of new moles or changes in moles, especially if there is a change in shape or color. Also, tell your health care provider if a mole is larger than the size of a pencil eraser.  A one-time screening for abdominal aortic aneurysm (AAA) and surgical repair of large AAAs by ultrasound is recommended for men aged 82-75 years who are current or former smokers.  Stay current with your vaccines (immunizations).   This information is not intended to replace advice given to you by your health care provider. Make sure you discuss any questions you have with your health care provider.   Document Released: 11/22/2007 Document Revised: 06/16/2014 Document Reviewed: 10/21/2010 Elsevier Interactive Patient Education [employer].

## 2015-10-04 NOTE — Telephone Encounter (Signed)
Pt will see you at 2pm today!!

## 2015-10-05 NOTE — Telephone Encounter (Signed)
Rx(s) sent to pharmacy electronically.  

## 2015-10-12 ENCOUNTER — Encounter: Payer: Self-pay | Admitting: Gastroenterology

## 2015-10-26 ENCOUNTER — Ambulatory Visit (AMBULATORY_SURGERY_CENTER): Payer: Medicare Other | Admitting: Gastroenterology

## 2015-10-26 ENCOUNTER — Encounter: Payer: Self-pay | Admitting: Gastroenterology

## 2015-10-26 VITALS — BP 112/64 | HR 54 | Temp 98.7°F | Resp 27 | Ht 70.0 in | Wt 161.0 lb

## 2015-10-26 DIAGNOSIS — K621 Rectal polyp: Secondary | ICD-10-CM

## 2015-10-26 DIAGNOSIS — D129 Benign neoplasm of anus and anal canal: Secondary | ICD-10-CM

## 2015-10-26 DIAGNOSIS — D125 Benign neoplasm of sigmoid colon: Secondary | ICD-10-CM

## 2015-10-26 DIAGNOSIS — D124 Benign neoplasm of descending colon: Secondary | ICD-10-CM

## 2015-10-26 DIAGNOSIS — D128 Benign neoplasm of rectum: Secondary | ICD-10-CM

## 2015-10-26 DIAGNOSIS — Z86010 Personal history of colonic polyps: Secondary | ICD-10-CM | POA: Diagnosis not present

## 2015-10-26 MED ORDER — SODIUM CHLORIDE 0.9 % IV SOLN: 500.0000 mL | INTRAVENOUS | Status: DC

## 2015-10-26 NOTE — Op Note (Signed)
[facility] Patient Name: Blake Evans Procedure Date: 10/26/2015 2:11 PM MRN: [id number] Endoscopist: Ladene Artist , MD Age: 65 Referring MD:  Date of Birth: [birth year] Gender: Male Procedure:                Colonoscopy Indications:              Surveillance: Personal history of adenomatous                            polyps on last colonoscopy > 5 years ago Medicines:                Monitored Anesthesia Care Procedure:                Pre-Anesthesia Assessment:                           - Prior to the procedure, a History and Physical                            was performed, and patient medications and                            allergies were reviewed. The patient's tolerance of                            previous anesthesia was also reviewed. The risks                            and benefits of the procedure and the sedation                            options and risks were discussed with the patient.                            All questions were answered, and informed consent                            was obtained. Prior Anticoagulants: The patient has                            taken Plavix (clopidogrel), last dose was 5 days                            prior to procedure. ASA Grade Assessment: III - A                            patient with severe systemic disease. After                            reviewing the risks and benefits, the patient was                            deemed in satisfactory condition to undergo the  procedure.                           After obtaining informed consent, the colonoscope                            was passed under direct vision. Throughout the                            procedure, the patient's blood pressure, pulse, and                            oxygen saturations were monitored continuously. The                            Model PCF-H190DL [id number]) scope was introduced   through the anus and advanced to the the cecum,                            identified by appendiceal orifice and ileocecal                            valve. The colonoscopy was performed without                            difficulty. The patient tolerated the procedure                            well. The quality of the bowel preparation was                            excellent. The ileocecal valve, appendiceal                            orifice, and rectum were photographed. Scope In: 2:25:21 PM Scope Out: 2:42:18 PM Scope Withdrawal Time: 0 hours 13 minutes 56 seconds  Total Procedure Duration: 0 hours 16 minutes 57 seconds  Findings:                 The digital rectal exam was normal.                           A 6 mm polyp was found in the descending colon. The                            polyp was sessile. The polyp was removed with a                            cold snare. Resection and retrieval were complete.                           Two sessile polyps were found in the rectum and                            sigmoid colon. The polyps  were 4 mm in size. These                            polyps were removed with a cold biopsy forceps.                            Resection and retrieval were complete.                           Internal hemorrhoids were found during                            retroflexion. The hemorrhoids were small and Grade                            I (internal hemorrhoids that do not prolapse).                           The exam was otherwise normal throughout the                            examined colon. Complications:            No immediate complications. Estimated Blood Loss:     Estimated blood loss: none. Impression:               - One 6 mm polyp in the descending colon, removed                            with a cold snare. Resected and retrieved.                           - Two 4 mm polyps in the rectum and in the sigmoid                            colon,  removed with a cold biopsy forceps. Resected                            and retrieved.                           - Internal hemorrhoids. Recommendation:           - Patient has a contact number available for                            emergencies. The signs and symptoms of potential                            delayed complications were discussed with the                            patient. Return to normal activities tomorrow.                            Written discharge instructions  were provided to the                            patient.                           - Resume previous diet.                           - Resume Plavix (clopidogrel) at prior dose                            tomorrow. Refer to managing physician for further                            adjustment of therapy.                           - Continue present medications.                           - Await pathology results.                           - Repeat colonoscopy in 5 years for surveillance. Ladene Artist, MD 10/26/2015 2:46:14 PM This report has been signed electronically.

## 2015-10-26 NOTE — Progress Notes (Signed)
Report to PACU, RN, vss, BBS= Clear.  

## 2015-10-26 NOTE — Patient Instructions (Signed)
YOU HAD AN ENDOSCOPIC PROCEDURE TODAY AT [facility]:   Refer to the procedure report that was given to you for any specific questions about what was found during the examination.  If the procedure report does not answer your questions, please call your gastroenterologist to clarify.  If you requested that your care partner not be given the details of your procedure findings, then the procedure report has been included in a sealed envelope for you to review at your convenience later.  YOU SHOULD EXPECT: Some feelings of bloating in the abdomen. Passage of more gas than usual.  Walking can help get rid of the air that was put into your GI tract during the procedure and reduce the bloating. If you had a lower endoscopy (such as a colonoscopy or flexible sigmoidoscopy) you may notice spotting of blood in your stool or on the toilet paper. If you underwent a bowel prep for your procedure, you may not have a normal bowel movement for a few days.  Please Note:  You might notice some irritation and congestion in your nose or some drainage.  This is from the oxygen used during your procedure.  There is no need for concern and it should clear up in a day or so.  SYMPTOMS TO REPORT IMMEDIATELY:   Following lower endoscopy (colonoscopy or flexible sigmoidoscopy):  Excessive amounts of blood in the stool  Significant tenderness or worsening of abdominal pains  Swelling of the abdomen that is new, acute  Fever of 100F or higher  For urgent or emergent issues, a gastroenterologist can be reached at any hour by calling [phone].  DIET: Your first meal following the procedure should be a small meal and then it is ok to progress to your normal diet. Heavy or fried foods are harder to digest and may make you feel nauseous or bloated.  Likewise, meals heavy in dairy and vegetables can increase bloating.  Drink plenty of fluids but you should avoid alcoholic beverages for 24 hours.  ACTIVITY:   You should plan to take it easy for the rest of today and you should NOT DRIVE or use heavy machinery until tomorrow (because of the sedation medicines used during the test).    FOLLOW UP: Our staff will call the number listed on your records the next business day following your procedure to check on you and address any questions or concerns that you may have regarding the information given to you following your procedure. If we do not reach you, we will leave a message.  However, if you are feeling well and you are not experiencing any problems, there is no need to return our call.  We will assume that you have returned to your regular daily activities without incident.  If any biopsies were taken you will be contacted by phone or by letter within the next 1-3 weeks.  Please call us at [phone] if you have not heard about the biopsies in 3 weeks.    SIGNATURES/CONFIDENTIALITY: You and/or your care partner have signed paperwork which will be entered into your electronic medical record.  These signatures attest to the fact that that the information above on your After Visit Summary has been reviewed and is understood.  Full responsibility of the confidentiality of this discharge information lies with you and/or your care-partner.  RESUME PLAVIX TOMORROW  NEXT COLONOSCOPY 5 YEARS  Please read over handouts about polyps and hemorrhoids

## 2015-10-26 NOTE — Progress Notes (Signed)
Called to room to assist during endoscopic procedure.  Patient ID and intended procedure confirmed with present staff. Received instructions for my participation in the procedure from the performing physician.  

## 2015-10-29 ENCOUNTER — Telehealth: Payer: Self-pay

## 2015-10-29 NOTE — Telephone Encounter (Signed)
  Follow up Call-  Call back number 10/26/2015  Post procedure Call Back phone  # [phone]  Permission to leave phone message Yes     Patient questions:  Do you have a fever, pain , or abdominal swelling? No. Pain Score  0 *  Have you tolerated food without any problems? Yes.    Have you been able to return to your normal activities? Yes.    Do you have any questions about your discharge instructions: Diet   No. Medications  No. Follow up visit  No.  Do you have questions or concerns about your Care? No.  Actions: * If pain score is 4 or above: No action needed, pain <4.

## 2015-10-30 ENCOUNTER — Encounter: Payer: Self-pay | Admitting: Gastroenterology

## 2016-01-14 ENCOUNTER — Other Ambulatory Visit: Payer: Self-pay | Admitting: Cardiovascular Disease

## 2016-03-11 ENCOUNTER — Other Ambulatory Visit: Payer: Self-pay | Admitting: Cardiovascular Disease

## 2016-04-14 ENCOUNTER — Other Ambulatory Visit: Payer: Self-pay | Admitting: Cardiovascular Disease

## 2016-04-21 ENCOUNTER — Other Ambulatory Visit: Payer: Self-pay | Admitting: Cardiovascular Disease

## 2016-04-21 NOTE — Telephone Encounter (Signed)
REFILL 

## 2016-05-13 ENCOUNTER — Other Ambulatory Visit: Payer: Self-pay | Admitting: Cardiovascular Disease

## 2016-05-13 NOTE — Telephone Encounter (Signed)
Rx has been sent to the pharmacy electronically. ° °

## 2016-09-30 ENCOUNTER — Other Ambulatory Visit: Payer: Medicare Other

## 2016-10-07 ENCOUNTER — Ambulatory Visit (INDEPENDENT_AMBULATORY_CARE_PROVIDER_SITE_OTHER): Payer: Medicare Other | Admitting: Adult Health

## 2016-10-07 ENCOUNTER — Encounter: Payer: Self-pay | Admitting: Adult Health

## 2016-10-07 VITALS — BP 116/60 | Temp 98.1°F | Ht 71.0 in | Wt 159.4 lb

## 2016-10-07 DIAGNOSIS — E785 Hyperlipidemia, unspecified: Secondary | ICD-10-CM

## 2016-10-07 DIAGNOSIS — Z23 Encounter for immunization: Secondary | ICD-10-CM

## 2016-10-07 DIAGNOSIS — Z0000 Encounter for general adult medical examination without abnormal findings: Secondary | ICD-10-CM

## 2016-10-07 DIAGNOSIS — I10 Essential (primary) hypertension: Secondary | ICD-10-CM | POA: Diagnosis not present

## 2016-10-07 LAB — CBC WITH DIFFERENTIAL/PLATELET
Basophils Absolute: 0.1 10*3/uL (ref 0.0–0.1)
Basophils Relative: 0.7 % (ref 0.0–3.0)
Eosinophils Absolute: 0.2 10*3/uL (ref 0.0–0.7)
Eosinophils Relative: 3 % (ref 0.0–5.0)
HCT: 45.8 % (ref 39.0–52.0)
Hemoglobin: 15.6 g/dL (ref 13.0–17.0)
Lymphocytes Relative: 26.2 % (ref 12.0–46.0)
Lymphs Abs: 1.9 10*3/uL (ref 0.7–4.0)
MCHC: 34.1 g/dL (ref 30.0–36.0)
MCV: 91.7 fl (ref 78.0–100.0)
Monocytes Absolute: 0.7 10*3/uL (ref 0.1–1.0)
Monocytes Relative: 10.2 % (ref 3.0–12.0)
Neutro Abs: 4.3 10*3/uL (ref 1.4–7.7)
Neutrophils Relative %: 59.9 % (ref 43.0–77.0)
Platelets: 202 10*3/uL (ref 150.0–400.0)
RBC: 5 Mil/uL (ref 4.22–5.81)
RDW: 13.2 % (ref 11.5–15.5)
WBC: 7.1 10*3/uL (ref 4.0–10.5)

## 2016-10-07 LAB — BASIC METABOLIC PANEL
BUN: 15 mg/dL (ref 6–23)
CO2: 32 mEq/L (ref 19–32)
Calcium: 9.4 mg/dL (ref 8.4–10.5)
Chloride: 103 mEq/L (ref 96–112)
Creatinine, Ser: 0.82 mg/dL (ref 0.40–1.50)
GFR: 99.77 mL/min (ref >60.00)
Glucose, Bld: 101 mg/dL — ABNORMAL HIGH (ref 70–99)
Potassium: 4.7 mEq/L (ref 3.5–5.1)
Sodium: 139 mEq/L (ref 135–145)

## 2016-10-07 LAB — HEPATIC FUNCTION PANEL
ALT: 33 U/L (ref 0–53)
AST: 27 U/L (ref 0–37)
Albumin: 4.5 g/dL (ref 3.5–5.2)
Alkaline Phosphatase: 52 U/L (ref 39–117)
Bilirubin, Direct: 0.4 mg/dL — ABNORMAL HIGH (ref 0.0–0.3)
Total Bilirubin: 3 mg/dL — ABNORMAL HIGH (ref 0.2–1.2)
Total Protein: 6.8 g/dL (ref 6.0–8.3)

## 2016-10-07 LAB — LIPID PANEL
Cholesterol: 137 mg/dL (ref 0–200)
HDL: 72.7 mg/dL (ref >39.00)
LDL Cholesterol: 48 mg/dL (ref 0–99)
NonHDL: 64.04
Total CHOL/HDL Ratio: 2
Triglycerides: 78 mg/dL (ref 0.0–149.0)
VLDL: 15.6 mg/dL (ref 0.0–40.0)

## 2016-10-07 LAB — TSH: TSH: 0.99 u[IU]/mL (ref 0.35–4.50)

## 2016-10-07 NOTE — Addendum Note (Signed)
Addended by: Sandria Bales B on: 10/07/2016 10:30 AM   Modules accepted: Orders

## 2016-10-07 NOTE — Patient Instructions (Addendum)
It was great seeing you today  I will follow up with you regarding your blood work   Follow up with me in one year   Blake Evans , Thank you for taking time to come for your Medicare Wellness Visit. I appreciate your ongoing commitment to your health goals. Please review the following plan we discussed and let me know if I can assist you in the future.    [facility]; [phone] Sr. Blake Evans; [phone] Get resource to get information on any and all community programs for Seniors  [place]: [phone] Community [facility] -[phone] Public Health Dept; Need to be a skilled visit but can assist with bathing as well; [phone]  [facility] - can assist with hearing aid x 1  No reviews  [facility]  [place] [address]  [phone] - Blake Evans   Will schedule eye exam    These are the goals we discussed: Goals    . patient          Watch sodium;        This is a list of the screening recommended for you and due dates:  Health Maintenance  Topic Date Due  . Pneumonia vaccines (1 of 2 - PCV13) 04/28/2015  . Flu Shot  01/07/2017  . Colon Cancer Screening  10/25/2020  . Tetanus Vaccine  03/17/2022  .  Hepatitis C: One time screening is recommended by Center for Disease Control  (CDC) for  adults born from [birth year] through 1966.   Completed    Blake Evans , Thank you for taking time to come for your Medicare Wellness Visit. I appreciate your ongoing commitment to your health goals. Please review the following plan we discussed and let me know if I can assist you in the future.   These are the goals we discussed: Goals    . patient          Watch sodium;        This is a list of the screening recommended for you and due dates:  Health Maintenance  Topic Date Due  . Pneumonia vaccines (1 of 2 - PCV13) 04/28/2015  . Flu Shot  01/07/2017  . Colon Cancer Screening  10/25/2020  . Tetanus Vaccine   03/17/2022  .  Hepatitis C: One time screening is recommended by Center for Disease Control  (CDC) for  adults born from [birth year] through 1966.   Completed   Prevention of falls: Remove rugs or any tripping hazards in the home Use Non slip mats in bathtubs and showers Placing grab bars next to the toilet and or shower Placing handrails on both sides of the stair way Adding extra lighting in the home.   Personal safety issues reviewed:  1. Consider starting a community watch program per [facility] 2.  Changes batteries is smoke detector and/or carbon monoxide detector  3.  If you have firearms; keep them in a safe place 4.  Wear protection when in the sun; Always wear sunscreen or a hat; It is good to have your doctor check your skin annually or review any new areas of concern 5. Driving safety; Keep in the right lane; stay 3 car lengths behind the car in front of you on the highway; look 3 times prior to pulling out; carry your cell phone everywhere you go!    Learn about the Yellow Dot program:  The program allows first responders at your  emergency to have access to who your physician is, as well as your medications and medical conditions.  Citizens requesting the Yellow Dot Packages should contact Master Corporal Blake Evans at the [facility] [phone] for the first week of the program and beginning the week after Easter citizens should contact their Scientist, physiological.     Health Maintenance, Male A healthy lifestyle and preventive care is important for your health and wellness. Ask your health care provider about what schedule of regular examinations is right for you. What should I know about weight and diet?  Eat a Healthy Diet  Eat plenty of vegetables, fruits, whole grains, low-fat dairy products, and lean protein.  Do not eat a lot of foods high in solid fats, added sugars, or salt. Maintain a Healthy Weight  Regular exercise can  help you achieve or maintain a healthy weight. You should:  Do at least 150 minutes of exercise each week. The exercise should increase your heart rate and make you sweat (moderate-intensity exercise).  Do strength-training exercises at least twice a week. Watch Your Levels of Cholesterol and Blood Lipids  Have your blood tested for lipids and cholesterol every 5 years starting at 67 years of age. If you are at high risk for heart disease, you should start having your blood tested when you are 67 years old. You may need to have your cholesterol levels checked more often if:  Your lipid or cholesterol levels are high.  You are older than 67 years of age.  You are at high risk for heart disease. What should I know about cancer screening? Many types of cancers can be detected early and may often be prevented. Lung Cancer  You should be screened every year for lung cancer if:  You are a current smoker who has smoked for at least 30 years.  You are a former smoker who has quit within the past 15 years.  Talk to your health care provider about your screening options, when you should start screening, and how often you should be screened. Colorectal Cancer  Routine colorectal cancer screening usually begins at 67 years of age and should be repeated every 5-10 years until you are 67 years old. You may need to be screened more often if early forms of precancerous polyps or small growths are found. Your health care provider may recommend screening at an earlier age if you have risk factors for colon cancer.  Your health care provider may recommend using home test kits to check for hidden blood in the stool.  A small camera at the end of a tube can be used to examine your colon (sigmoidoscopy or colonoscopy). This checks for the earliest forms of colorectal cancer. Prostate and Testicular Cancer  Depending on your age and overall health, your health care provider may do certain tests to screen  for prostate and testicular cancer.  Talk to your health care provider about any symptoms or concerns you have about testicular or prostate cancer. Skin Cancer  Check your skin from head to toe regularly.  Tell your health care provider about any new moles or changes in moles, especially if:  There is a change in a mole's size, shape, or color.  You have a mole that is larger than a pencil eraser.  Always use sunscreen. Apply sunscreen liberally and repeat throughout the day.  Protect yourself by wearing long sleeves, pants, a wide-brimmed hat, and sunglasses when outside. What should I know about  heart disease, diabetes, and high blood pressure?  If you are 35-62 years of age, have your blood pressure checked every 3-5 years. If you are 74 years of age or older, have your blood pressure checked every year. You should have your blood pressure measured twice-once when you are at a hospital or clinic, and once when you are not at a hospital or clinic. Record the average of the two measurements. To check your blood pressure when you are not at a hospital or clinic, you can use:  An automated blood pressure machine at a pharmacy.  A home blood pressure monitor.  Talk to your health care provider about your target blood pressure.  If you are between 31-29 years old, ask your health care provider if you should take aspirin to prevent heart disease.  Have regular diabetes screenings by checking your fasting blood sugar level.  If you are at a normal weight and have a low risk for diabetes, have this test once every three years after the age of 44.  If you are overweight and have a high risk for diabetes, consider being tested at a younger age or more often.  A one-time screening for abdominal aortic aneurysm (AAA) by ultrasound is recommended for men aged 52-75 years who are current or former smokers. What should I know about preventing infection? Hepatitis B  If you have a higher risk  for hepatitis B, you should be screened for this virus. Talk with your health care provider to find out if you are at risk for hepatitis B infection. Hepatitis C  Blood testing is recommended for:  Everyone born from [birth year] through 1966.  Anyone with known risk factors for hepatitis C. Sexually Transmitted Diseases (STDs)  You should be screened each year for STDs including gonorrhea and chlamydia if:  You are sexually active and are younger than 67 years of age.  You are older than 67 years of age and your health care provider tells you that you are at risk for this type of infection.  Your sexual activity has changed since you were last screened and you are at an increased risk for chlamydia or gonorrhea. Ask your health care provider if you are at risk.  Talk with your health care provider about whether you are at high risk of being infected with HIV. Your health care provider may recommend a prescription medicine to help prevent HIV infection. What else can I do?  Schedule regular health, dental, and eye exams.  Stay current with your vaccines (immunizations).  Do not use any tobacco products, such as cigarettes, chewing tobacco, and e-cigarettes. If you need help quitting, ask your health care provider.  Limit alcohol intake to no more than 2 drinks per day. One drink equals 12 ounces of beer, 5 ounces of wine, or 1 ounces of hard liquor.  Do not use street drugs.  Do not share needles.  Ask your health care provider for help if you need support or information about quitting drugs.  Tell your health care provider if you often feel depressed.  Tell your health care provider if you have ever been abused or do not feel safe at home. This information is not intended to replace advice given to you by your health care provider. Make sure you discuss any questions you have with your health care provider. Document Released: 11/22/2007 Document Revised: 01/23/2016 Document Reviewed:  02/27/2015 Elsevier Interactive Patient Education  2017 [place] Prevention in the  Home Falls can cause injuries and can affect people from all age groups. There are many simple things that you can do to make your home safe and to help prevent falls. What can I do on the outside of my home?  Regularly repair the edges of walkways and driveways and fix any cracks.  Remove high doorway thresholds.  Trim any shrubbery on the main path into your home.  Use bright outdoor lighting.  Clear walkways of debris and clutter, including tools and rocks.  Regularly check that handrails are securely fastened and in good repair. Both sides of any steps should have handrails.  Install guardrails along the edges of any raised decks or porches.  Have leaves, snow, and ice cleared regularly.  Use sand or salt on walkways during winter months.  In the garage, clean up any spills right away, including grease or oil spills. What can I do in the bathroom?  Use night lights.  Install grab bars by the toilet and in the tub and shower. Do not use towel bars as grab bars.  Use non-skid mats or decals on the floor of the tub or shower.  If you need to sit down while you are in the shower, use a plastic, non-slip stool.  Keep the floor dry. Immediately clean up any water that spills on the floor.  Remove soap buildup in the tub or shower on a regular basis.  Attach bath mats securely with double-sided non-slip rug tape.  Remove throw rugs and other tripping hazards from the floor. What can I do in the bedroom?  Use night lights.  Make sure that a bedside light is easy to reach.  Do not use oversized bedding that drapes onto the floor.  Have a firm chair that has side arms to use for getting dressed.  Remove throw rugs and other tripping hazards from the floor. What can I do in the kitchen?  Clean up any spills right away.  Avoid walking on wet floors.  Place frequently  used items in easy-to-reach places.  If you need to reach for something above you, use a sturdy step stool that has a grab bar.  Keep electrical cables out of the way.  Do not use floor polish or wax that makes floors slippery. If you have to use wax, make sure that it is non-skid floor wax.  Remove throw rugs and other tripping hazards from the floor. What can I do in the stairways?  Do not leave any items on the stairs.  Make sure that there are handrails on both sides of the stairs. Fix handrails that are broken or loose. Make sure that handrails are as long as the stairways.  Check any carpeting to make sure that it is firmly attached to the stairs. Fix any carpet that is loose or worn.  Avoid having throw rugs at the top or bottom of stairways, or secure the rugs with carpet tape to prevent them from moving.  Make sure that you have a light switch at the top of the stairs and the bottom of the stairs. If you do not have them, have them installed. What are some other fall prevention tips?  Wear closed-toe shoes that fit well and support your feet. Wear shoes that have rubber soles or low heels.  When you use a stepladder, make sure that it is completely opened and that the sides are firmly locked. Have someone hold the ladder while you are using it. Do not climb  a closed stepladder.  Add color or contrast paint or tape to grab bars and handrails in your home. Place contrasting color strips on the first and last steps.  Use mobility aids as needed, such as canes, walkers, scooters, and crutches.  Turn on lights if it is dark. Replace any light bulbs that burn out.  Set up furniture so that there are clear paths. Keep the furniture in the same spot.  Fix any uneven floor surfaces.  Choose a carpet design that does not hide the edge of steps of a stairway.  Be aware of any and all pets.  Review your medicines with your healthcare provider. Some medicines can cause dizziness  or changes in blood pressure, which increase your risk of falling. Talk with your health care provider about other ways that you can decrease your risk of falls. This may include working with a physical therapist or trainer to improve your strength, balance, and endurance. This information is not intended to replace advice given to you by your health care provider. Make sure you discuss any questions you have with your health care provider. Document Released: 05/16/2002 Document Revised: 10/23/2015 Document Reviewed: 06/30/2014 Elsevier Interactive Patient Education  2017 [employer].

## 2016-10-07 NOTE — Progress Notes (Signed)
I have reviewed and agree with this plan  

## 2016-10-07 NOTE — Progress Notes (Signed)
Subjective:   Blake Evans is a 66 y.o. male who presents for Medicare Annual/Subsequent preventive examination.  HRA assessment completed during this visit with Blake Blake Evans seeing [name] today   The Patient was informed that the wellness visit is to identify future health risk and educate and initiate measures that can reduce risk for increased disease through the lifespan.    NO ROS; Medicare Wellness Visit Last OV:  last visit 04/27 and fup today Labs completed: 09/2015  Chol/hdl ratio is 2; HDL 58;  Diabetes neg  Medical Hx; stemi 11/2011 S/p angioplasty 11/2011 and cardiac rehab; no issues since  Family hx; mother had DM; father had CAD; HD; Hyperlipidemia  Describes health as fair, good or great? Great   Update:  Tobacco: never smoked   How many drinks do you have per week? Occasional   Medications no isuses  BMI: 22  Diet;   Try to eat healthy low fat diet 3 meals a day Fruits, vegetables; water is all he drinks  Lost 20 lbs when he had his MI  Retired in 2011   Exercise;  runs 2 times a week  30 to 35 minutes Rides his bike    [place];  Fall hx; did fall x 1 and was working on [employer], civil (consulting): no issues Given education on "Fall Prevention in the Home" for more safety tips the patient can apply as appropriate.  Long term goal is to "age in place" or undecided   Safety features reviewed for safe community; firearms if in the home; smoke alarms; sun protection when outside; driving difficulties or accidents  Mental Health: mother had dementia; she is 10 She is [facility]isted living    Any emotional problems? Anxious, depressed, irritable, sad or blue? no Denies feeling depressed or hopeless; voices pleasure in daily life How many social activities have you been engaged in within the last 2 weeks? 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 no  Sleep pattern changes; sleeps well   Hearing:  Unable to test but does plan to fup with AIM sometimes this year  Hearing Screening Comments: Hearing issues Did a hearing check x 66 yo  Trouble hearing well in crowds  Results were mixed  Declined hearing aid at that point [place]: Wears glasses;  Apt every 2 years Dr. Gershon Crane to evaluate     Advanced Directive addressed; Completed   Prevnar due  and taken today   Colonoscopy; 10/2015 due 10/2020 Prostate cancer screening: 09/2015 4.39       Objective:    Vitals: BP 116/60 (BP Location: Left Arm, Patient Position: Sitting, Cuff Size: Normal)   Temp 98.1 F (36.7 C) (Oral)   Ht 5\' 11"  (1.803 m)   Wt 159 lb 6.4 oz (72.3 kg)   BMI 22.23 kg/m   Body mass index is  22.23 kg/m.  Tobacco History  Smoking Status  . Never Smoker  Smokeless Tobacco  . Never Used     Counseling given: Not Answered   Past Medical History:  Diagnosis Date  . Adenomatous colon polyp 2006  . Allergy    SEASONAL  . BPH (benign prostatic hyperplasia) 11/21/2011  . CAD (coronary artery disease), Residual disease of LAD and LCX 11/21/2011   myoview 01/09/12-positive bruce treadmill test with ventricular bigemeny, 2-3 mm horizontal ST segment depressions in II,III, AVF at peak exercise  . Hyperlipidemia 11/25/2011  . Kidney stone   . S/P angioplasty with stent, to LAD/Diag and AV groove, LCX.  11/24/11 11/25/2011  . STEMI (ST elevation myocardial  infarction), ST elevation in inf. wall 11/21/2011   Past Surgical History:  Procedure Laterality Date  . CORONARY ANGIOPLASTY WITH STENT PLACEMENT  11/21/11   inferior wall MI; PCI and stenting to distal RCA, Resolute DES 3x27mm  . CORONARY ANGIOPLASTY WITH STENT PLACEMENT  11/24/11   distal LAD diag branch PCI and stenting with a resolute DES along with mid AV groove circ PCI with a resolute DES   . LEFT HEART CATHETERIZATION WITH CORONARY ANGIOGRAM N/A 11/21/2011   Procedure: LEFT HEART CATHETERIZATION WITH CORONARY ANGIOGRAM;  Surgeon: Lorretta Harp, MD;  Location: [facility] CATH LAB;  Service: Cardiovascular;  Laterality: N/A;  . NO PAST SURGERIES    . PERCUTANEOUS CORONARY STENT INTERVENTION (PCI-S)  11/21/2011   Procedure: PERCUTANEOUS CORONARY STENT INTERVENTION (PCI-S);  Surgeon: Lorretta Harp, MD;  Location: [facility] CATH LAB;  Service: Cardiovascular;;  . PERCUTANEOUS CORONARY STENT INTERVENTION (PCI-S) N/A 11/24/2011   Procedure: PERCUTANEOUS CORONARY STENT INTERVENTION (PCI-S);  Surgeon: Lorretta Harp, MD;  Location: [facility] CATH LAB;  Service: Cardiovascular;  Laterality: N/A;   Family History  Problem Relation Age of Onset  . Coronary artery disease Father   . Heart disease Father   . Hyperlipidemia Father   . Diabetes Mother   . Diabetes Sister    History  Sexual Activity  . Sexual activity: Yes  . Birth control/ protection: None    Outpatient Encounter Prescriptions as of 10/07/2016  Medication Sig  . aspirin 81 MG tablet Take 81 mg by mouth 2 (two) times daily.   Marland Kitchen atorvastatin (LIPITOR) 40 MG tablet TAKE 1 TABLET BY MOUTH  DAILY AT 6 PM  . clopidogrel (PLAVIX) 75 MG tablet TAKE 1 TABLET BY MOUTH DAILY.  . finasteride (PROSCAR) 5 MG tablet Take 1 tablet by mouth daily.  . fluorouracil (EFUDEX) 5 % cream APPLY ON THE SKIN TWICE A DAY ,PATIENT IS AWARE TO USE FOR 2 WEEKS ON AREAS TALKED ABOUT  . ipratropium (ATROVENT) 0.03 % nasal spray Place 2 sprays into both nostrils 2 (two)  times daily.  Marland Kitchen lisinopril (PRINIVIL,ZESTRIL) 2.5 MG tablet Take 1 tablet by mouth  daily  . Multiple Vitamin (MULTIVITAMIN) tablet Take 1 tablet by mouth daily.  . nitroGLYCERIN (NITROSTAT) 0.4 MG SL tablet Place 1 tablet (0.4 mg  total) under the tongue  every 5 (five) minutes x 3  doses as needed for chest  pain.  . saw palmetto 80 MG capsule Take 80 mg by mouth daily.  . [DISCONTINUED] benzonatate (TESSALON) 100 MG capsule Take 1-2 capsules (100-200 mg total) by mouth 3 (three) times daily as needed for cough.  . [DISCONTINUED] doxycycline (VIBRAMYCIN) 100 MG capsule Take 1 capsule (100 mg total) by mouth 2 (two) times daily.  . [DISCONTINUED] atorvastatin (LIPITOR) 40 MG tablet TAKE 1 TABLET (  40 MG TOTAL) BY MOUTH DAILY AT 6 PM. APPOINTMENT NEEDED FOR FUTURE REFILLS  . [DISCONTINUED] lisinopril (PRINIVIL,ZESTRIL) 2.5 MG tablet Take 1 tablet (2.5 mg total) by mouth daily.  . [DISCONTINUED] magic mouthwash w/lidocaine SOLN Take 10 mLs by mouth every 2 (two) hours as needed for mouth pain.   No facility-administered encounter medications on file as of 10/07/2016.     Activities of Daily Living In your present state of health, do you have any difficulty performing the following activities: 10/07/2016  Hearing? Y  Vision? N  Difficulty concentrating or making decisions? N  Walking or climbing stairs? N  Dressing or bathing? N  Doing errands, shopping? N  Preparing Food and eating ? N  Using the Toilet? N  In the past six months, have you accidently leaked urine? N  Do you have problems with loss of bowel control? N  Managing your Medications? N  Managing your Finances? N  Housekeeping or managing your Housekeeping? N  Some recent data might be hidden    Patient Care Team: Dorothyann Peng, NP as PCP - General (Family Medicine)   Assessment:     Exercise Activities and Dietary recommendations Current Exercise Habits: Home exercise routine, Time (Minutes): 35, Frequency (Times/Week): 4,  Weekly Exercise (Minutes/Week): 140, Intensity: Moderate  Goals    . patient          Watch sodium;       Fall Risk Fall Risk  10/07/2016 10/04/2015 10/01/2015 08/03/2015  Falls in the past year? No Yes No No  Number falls in past yr: - 1 - -  Injury with Fall? 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 Influenza Split 03/09/2012  . Influenza-Unspecified 04/09/2014  . Pneumococcal Conjugate-13 10/07/2016  . Td 06/10/1999  . Tdap 03/17/2012   Screening Tests Health Maintenance  Topic Date Due  . PNA vac Low Risk Adult (1 of 2 - PCV13) 04/28/2015  . INFLUENZA VACCINE  01/07/2017  . COLONOSCOPY  10/25/2020  . TETANUS/TDAP  03/17/2022  . Hepatitis C Screening  Completed      Plan:    PCP Notes  Health Maintenance Reviewed Hep c which was neg Educated regarding shingles  Took Prevnar and educated as to one more pneumonia vaccine next year  Will have eyes checked   Will probably have his hearing checked again at [employer] for hearing aid  Abnormal Screens none  Referrals none  Patient concerns; no   Nurse Concerns; no  Next PCP apt apt was today     I have personally reviewed and noted the following in the patient's chart:   . Medical and social history . Use of alcohol, tobacco or illicit drugs  . Current medications and supplements . Functional ability and status . Nutritional status . Physical activity . Advanced directives . List of other physicians . Hospitalizations, surgeries, and ER visits in previous 12 months . Vitals . Screenings to include cognitive, depression, and falls . Referrals and appointments  In addition, I  have reviewed and discussed with patient certain preventive protocols, quality metrics, and best practice recommendations. A written personalized care  plan for preventive services as well as general preventive health recommendations were provided to patient.    Blake. Ostrand is very active and sticks to a heart health diet. He will try to watch is sodium To continue his plan    Wynetta Fines, RN  10/07/2016

## 2016-10-07 NOTE — Progress Notes (Signed)
Subjective:    Patient ID: Blake Evans, male    DOB: [birth year], 66 y.o.   MRN: [id number]  HPI  Patient presents for yearly preventative medicine examination. He is a pleasant 66 year old fit male who  has a past medical history of Adenomatous colon polyp (2006); Allergy; BPH (benign prostatic hyperplasia) (11/21/2011); CAD (coronary artery disease), Residual disease of LAD and LCX (11/21/2011); Hyperlipidemia (11/25/2011); Kidney stone; S/P angioplasty with stent, to LAD/Diag and AV groove, LCX.  11/24/11 (11/25/2011); and STEMI (ST elevation myocardial infarction), ST elevation in inf. wall (11/21/2011).   All immunizations and health maintenance protocols were reviewed with the patient and needed orders were placed. He is due for Prevnar 13.   Appropriate screening laboratory values were ordered for the patient including screening of hyperlipidemia, renal function and hepatic function. If indicated by BPH, a PSA was ordered.  Medication reconciliation,  past medical history, social history, problem list and allergies were reviewed in detail with the patient  Goals were established with regard to weight loss, exercise, and  diet in compliance with medications. He stays active and tries to eat healthy.   End of life planning was discussed- he has advanced directives and a living will.   He takes lisinopril 2.5 mg for hypertension control.   He takes Lipitor 40 mg for hyperlipidemia.   He is up to date on his vision and dental screens. He is also up to date on his colonoscopy  He denies any interval history.    He has not seen cardiology yet this year.  He has seen Dermatology recently for a skin check. Denies any issues. He has also seen Urology a month ago and reports that his PSA  Is coming down. .   Review of Systems  Constitutional: Negative.   HENT: Positive for hearing loss (chronic ).   Eyes: Negative.   Respiratory: Negative.   Cardiovascular: Negative.     Gastrointestinal: Negative.   Endocrine: Negative.   Genitourinary: Negative.   Musculoskeletal: Negative.   Skin: Negative.   Allergic/Immunologic: Negative.   Neurological: Negative.   Hematological: Negative.   Psychiatric/Behavioral: Negative.   All other systems reviewed and are negative.  Past Medical History:  Diagnosis Date  . Adenomatous colon polyp 2006  . Allergy    SEASONAL  . BPH (benign prostatic hyperplasia) 11/21/2011  . CAD (coronary artery disease), Residual disease of LAD and LCX 11/21/2011   myoview 01/09/12-positive bruce treadmill test with ventricular bigemeny, 2-3 mm horizontal ST segment depressions in II,III, AVF at peak exercise  . Hyperlipidemia 11/25/2011  . Kidney stone   . S/P angioplasty with stent, to LAD/Diag and AV groove, LCX.  11/24/11 11/25/2011  . STEMI (ST elevation myocardial infarction), ST elevation in inf. wall 11/21/2011    Social History   Social History  . Marital status: Single    Spouse name: N/A  . Number of children: N/A  . Years of education: N/A   Occupational History  . Not on file.   Social History Main Topics  . Smoking status: Never Smoker  . Smokeless tobacco: Never Used  . Alcohol use 0.0 oz/week     Comment: occasional  . Drug use: No  . Sexual activity: Yes    Birth control/ protection: None   Other Topics Concern  . 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 CORONARY ANGIOPLASTY WITH STENT PLACEMENT  11/21/11   inferior wall MI; PCI and stenting to distal RCA, Resolute DES 3x77mm  . CORONARY ANGIOPLASTY WITH STENT PLACEMENT  11/24/11   distal LAD diag branch PCI and stenting with a resolute DES along with mid AV groove circ PCI with a resolute DES   . LEFT HEART CATHETERIZATION WITH CORONARY ANGIOGRAM N/A 11/21/2011   Procedure: LEFT  HEART CATHETERIZATION WITH CORONARY ANGIOGRAM;  Surgeon: Lorretta Harp, MD;  Location: [facility] CATH LAB;  Service: Cardiovascular;  Laterality: N/A;  . NO PAST SURGERIES    . PERCUTANEOUS CORONARY STENT INTERVENTION (PCI-S)  11/21/2011   Procedure: PERCUTANEOUS CORONARY STENT INTERVENTION (PCI-S);  Surgeon: Lorretta Harp, MD;  Location: [facility] CATH LAB;  Service: Cardiovascular;;  . PERCUTANEOUS CORONARY STENT INTERVENTION (PCI-S) N/A 11/24/2011   Procedure: PERCUTANEOUS CORONARY STENT INTERVENTION (PCI-S);  Surgeon: Lorretta Harp, MD;  Location: [facility] CATH LAB;  Service: Cardiovascular;  Laterality: N/A;    Family History  Problem Relation Age of Onset  . Coronary artery disease Father   . Heart disease Father   . Hyperlipidemia Father   . Diabetes Mother   . Diabetes Sister     No Known Allergies  Current Outpatient Prescriptions on File Prior to Visit  Medication Sig Dispense Refill  . aspirin 81 MG tablet Take 81 mg by mouth 2 (two) times daily.     Marland Kitchen atorvastatin (LIPITOR) 40 MG tablet TAKE 1 TABLET BY MOUTH  DAILY AT 6 PM 90 tablet 3  . clopidogrel (PLAVIX) 75 MG tablet TAKE 1 TABLET BY MOUTH DAILY. 30 tablet 11  . finasteride (PROSCAR) 5 MG tablet Take 1 tablet by mouth daily.    . fluorouracil (EFUDEX) 5 % cream APPLY ON THE SKIN TWICE A DAY ,PATIENT IS AWARE TO USE FOR 2 WEEKS ON AREAS TALKED ABOUT  0  . ipratropium (ATROVENT) 0.03 % nasal spray Place 2 sprays into both nostrils 2 (two) times daily. 30 mL 0  . lisinopril (PRINIVIL,ZESTRIL) 2.5 MG tablet Take 1 tablet by mouth  daily 90 tablet 3  . Multiple Vitamin (MULTIVITAMIN) tablet Take 1 tablet by mouth daily.    . nitroGLYCERIN (NITROSTAT) 0.4 MG SL tablet Place 1 tablet (0.4 mg  total) under the tongue  every 5 (five) minutes x 3  doses as needed for chest  pain. 100 tablet 0  . saw palmetto 80 MG capsule Take 80 mg by mouth daily.     No current facility-administered medications on file prior to visit.     BP 116/60 (BP  Location: Left Arm, Patient Position: Sitting, Cuff Size: Normal)   Temp 98.1 F (36.7 C) (Oral)   Wt 159 lb 6.4 oz (72.3 kg)   BMI 22.87 kg/m       Objective:   Physical Exam  Constitutional: He is oriented to person, place, and time. He appears well-developed and well-nourished. No distress.  HENT:  Head: Normocephalic and atraumatic.  Right Ear: External ear normal.  Left Ear: External ear normal.  Nose: Nose normal.  Mouth/Throat: Oropharynx is clear and moist. No oropharyngeal exudate.  Eyes: Conjunctivae and EOM are normal. Pupils are equal, round, and reactive to light. Right eye exhibits no discharge. Left eye exhibits no discharge. No scleral icterus.  Neck: Normal range of motion. Neck supple. No JVD present. No tracheal deviation present.  No thyromegaly present.  Cardiovascular: Normal rate, regular rhythm, normal heart sounds and intact distal pulses.  Exam reveals no gallop and no friction rub.   No murmur heard. Pulmonary/Chest: Effort normal and breath sounds normal. No stridor. No respiratory distress. He has no wheezes. He has no rales. He exhibits no tenderness.  Abdominal: Soft. Bowel sounds are normal. He exhibits no distension and no mass. There is no tenderness. There is no rebound and no guarding.  Genitourinary:  Genitourinary Comments: Done by urology    Musculoskeletal: Normal range of motion. He exhibits no edema, tenderness or deformity.  Neurological: He is alert and oriented to person, place, and time. He has normal reflexes. He displays normal reflexes. No cranial nerve deficit. He exhibits normal muscle tone. Coordination normal.  Skin: Skin is warm and dry. No rash noted. He is not diaphoretic. No erythema. No pallor.  Psychiatric: He has a normal mood and affect. His behavior is normal. Judgment and thought content normal.  Nursing note and vitals reviewed.     Assessment & Plan:  1. Routine general medical examination at a health care facility -  Benign exam  - Prevnar 13  - Of course, continue to eat healthy and exercise.  - Follow up in one year or sooner if needed - Follow up with Cardiology this year - Basic metabolic panel - CBC with Differential/Platelet - Hepatic function panel - Lipid panel - TSH  2. Essential hypertension - Continue with Lisinopril 2.5 mg  - Basic metabolic panel - CBC with Differential/Platelet - Hepatic function panel - Lipid panel - TSH  3. Hyperlipidemia, unspecified hyperlipidemia type - Continue with Lipitor 40 mg  - Basic metabolic panel - CBC with Differential/Platelet - Hepatic function panel - Lipid panel - TSH  Dorothyann Peng, NP

## 2016-10-09 ENCOUNTER — Ambulatory Visit (INDEPENDENT_AMBULATORY_CARE_PROVIDER_SITE_OTHER): Payer: Medicare Other | Admitting: Cardiovascular Disease

## 2016-10-09 ENCOUNTER — Encounter: Payer: Self-pay | Admitting: Cardiovascular Disease

## 2016-10-09 VITALS — BP 108/68 | HR 66 | Ht 71.0 in | Wt 157.0 lb

## 2016-10-09 DIAGNOSIS — I10 Essential (primary) hypertension: Secondary | ICD-10-CM | POA: Diagnosis not present

## 2016-10-09 DIAGNOSIS — E7800 Pure hypercholesterolemia, unspecified: Secondary | ICD-10-CM | POA: Diagnosis not present

## 2016-10-09 DIAGNOSIS — I2119 ST elevation (STEMI) myocardial infarction involving other coronary artery of inferior wall: Secondary | ICD-10-CM

## 2016-10-09 NOTE — Assessment & Plan Note (Signed)
History of hyperlipidemia on statin therapy with recent lipid profile performed 10/07/16 revealing total cholesterol 137, LDL 48 and HDL of 72.

## 2016-10-09 NOTE — Assessment & Plan Note (Signed)
History of inferior STEMI status post RCA intervention at 2:00 in the morning by myself 11/21/11. I brought him back several days later at intervening on his LAD diagonal branch and circumflex as well. He has done well since. He remains on dual antiplatelet therapy. He is very active and denies chest pain or shortness of breath.

## 2016-10-09 NOTE — Progress Notes (Signed)
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 I last saw him in the office 09/13/15.His cardiovascular risk factor profile is positive for hyperlipidemia and a family history of a father that had bypass surgery. 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 I brought him to the cath lab at 2:00 in the morning and it demonstrated an occluded dominant distal RCA which I stented. He had circumflex and LAD diagonal branch disease as well. I brought him back 3 days later and in a staged fashion stented his distal LAD diagonal branch as well as his mid AV groove circumflex and he was discharged the following day. He feels well and has had no recurrent symptoms. He has modified his diet and participated in cardiac rehab. Since I last saw him he denies chest pain or shortness of breath. Recent lipid profile performed 10/07/16 revealed total cholesterol 137, LDL 48 and HDL of 72  Current Outpatient Prescriptions  Medication Sig Dispense Refill  . aspirin 81 MG tablet Take 81 mg by mouth 2 (two) times daily.     Marland Kitchen atorvastatin (LIPITOR) 40 MG tablet TAKE 1 TABLET BY MOUTH  DAILY AT 6 PM 90 tablet 3  . clopidogrel (PLAVIX) 75 MG tablet TAKE 1 TABLET BY MOUTH DAILY. 30 tablet 11  . finasteride (PROSCAR) 5 MG tablet Take 1 tablet by mouth daily.    . fluorouracil (EFUDEX) 5 % cream APPLY ON THE SKIN TWICE A DAY ,PATIENT IS AWARE TO USE FOR 2 WEEKS ON AREAS TALKED ABOUT  0  . lisinopril (PRINIVIL,ZESTRIL) 2.5 MG tablet Take 1 tablet by mouth  daily 90 tablet 3  . Multiple Vitamin (MULTIVITAMIN) tablet Take 1 tablet by mouth daily.    . nitroGLYCERIN  (NITROSTAT) 0.4 MG SL tablet Place 1 tablet (0.4 mg  total) under the tongue  every 5 (five) minutes x 3  doses as needed for chest  pain. 100 tablet 0  . saw palmetto 80 MG capsule Take 80 mg by mouth daily.     No current facility-administered medications for this visit.     No Known Allergies  Social History   Social History  . Marital status: Single    Spouse name: N/A  . Number of children: N/A  . Years of education: N/A   Occupational History  . Not on file.   Social History Main Topics  . Smoking status: Never Smoker  . Smokeless tobacco: Never Used  . Alcohol use 0.0 oz/week     Comment: occasional  . Drug use: No  . Sexual activity: Yes    Birth control/ protection: None   Other Topics Concern  . 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  Cardiovascular: negative for chest pain, dyspnea on exertion, edema, orthopnea, palpitations, paroxysmal nocturnal dyspnea or shortness of breath Dermatological: negative for rash Respiratory: negative for cough or  wheezing Urologic: negative for hematuria Abdominal: negative for nausea, vomiting, diarrhea, bright red blood per rectum, melena, or hematemesis Neurologic: negative for visual changes, syncope, or dizziness All other systems reviewed and are otherwise negative except as noted above.    Blood pressure 108/68, pulse 66, height 5\' 11"  (1.803 m), weight 157 lb (71.2 kg).  General appearance: alert and no distress Neck: no adenopathy, no carotid bruit, no JVD, supple, symmetrical, trachea midline and thyroid not enlarged, symmetric, no tenderness/mass/nodules Lungs: clear to auscultation bilaterally Heart: regular rate and rhythm, S1, S2 normal, no murmur, click, rub or gallop Extremities: extremities  normal, atraumatic, no cyanosis or edema  EKG sinus rhythm at 66 without ST or T-wave changes. I personally reviewed this EKG  ASSESSMENT AND PLAN:   Hyperlipidemia History of hyperlipidemia on statin therapy with recent lipid profile performed 10/07/16 revealing total cholesterol 137, LDL 48 and HDL of 72.  STEMI (ST elevation myocardial infarction), ST elevation in inf. wall, s/p DES resolute to RCA 11/21/11 History of inferior STEMI status post RCA intervention at 2:00 in the morning by myself 11/21/11. I brought him back several days later at intervening on his LAD diagonal branch and circumflex as well. He has done well since. He remains on dual antiplatelet therapy. He is very active and denies chest pain or shortness of breath.  Essential hypertension History of hypertension with blood pressure measured at 108/68. He is on lisinopril. Continue current meds at current dosing      Lorretta Harp MD [facility] 10/09/2016 4:30 PM

## 2016-10-09 NOTE — Assessment & Plan Note (Signed)
History of hypertension with blood pressure measured at 108/68. He is on lisinopril. Continue current meds at current dosing

## 2016-10-09 NOTE — Patient Instructions (Signed)

## 2016-10-25 ENCOUNTER — Other Ambulatory Visit: Payer: Self-pay | Admitting: Cardiovascular Disease

## 2016-10-27 NOTE — Telephone Encounter (Signed)
Rx request sent to pharmacy.  

## 2016-11-30 IMAGING — CR DG HAND COMPLETE 3+V*R*
3 series · 3 of 3 positions shown · non-contrast
Comparison: None.

CLINICAL DATA: Right hand pain.

EXAM:
RIGHT HAND - COMPLETE 3+ VIEW

[PA]
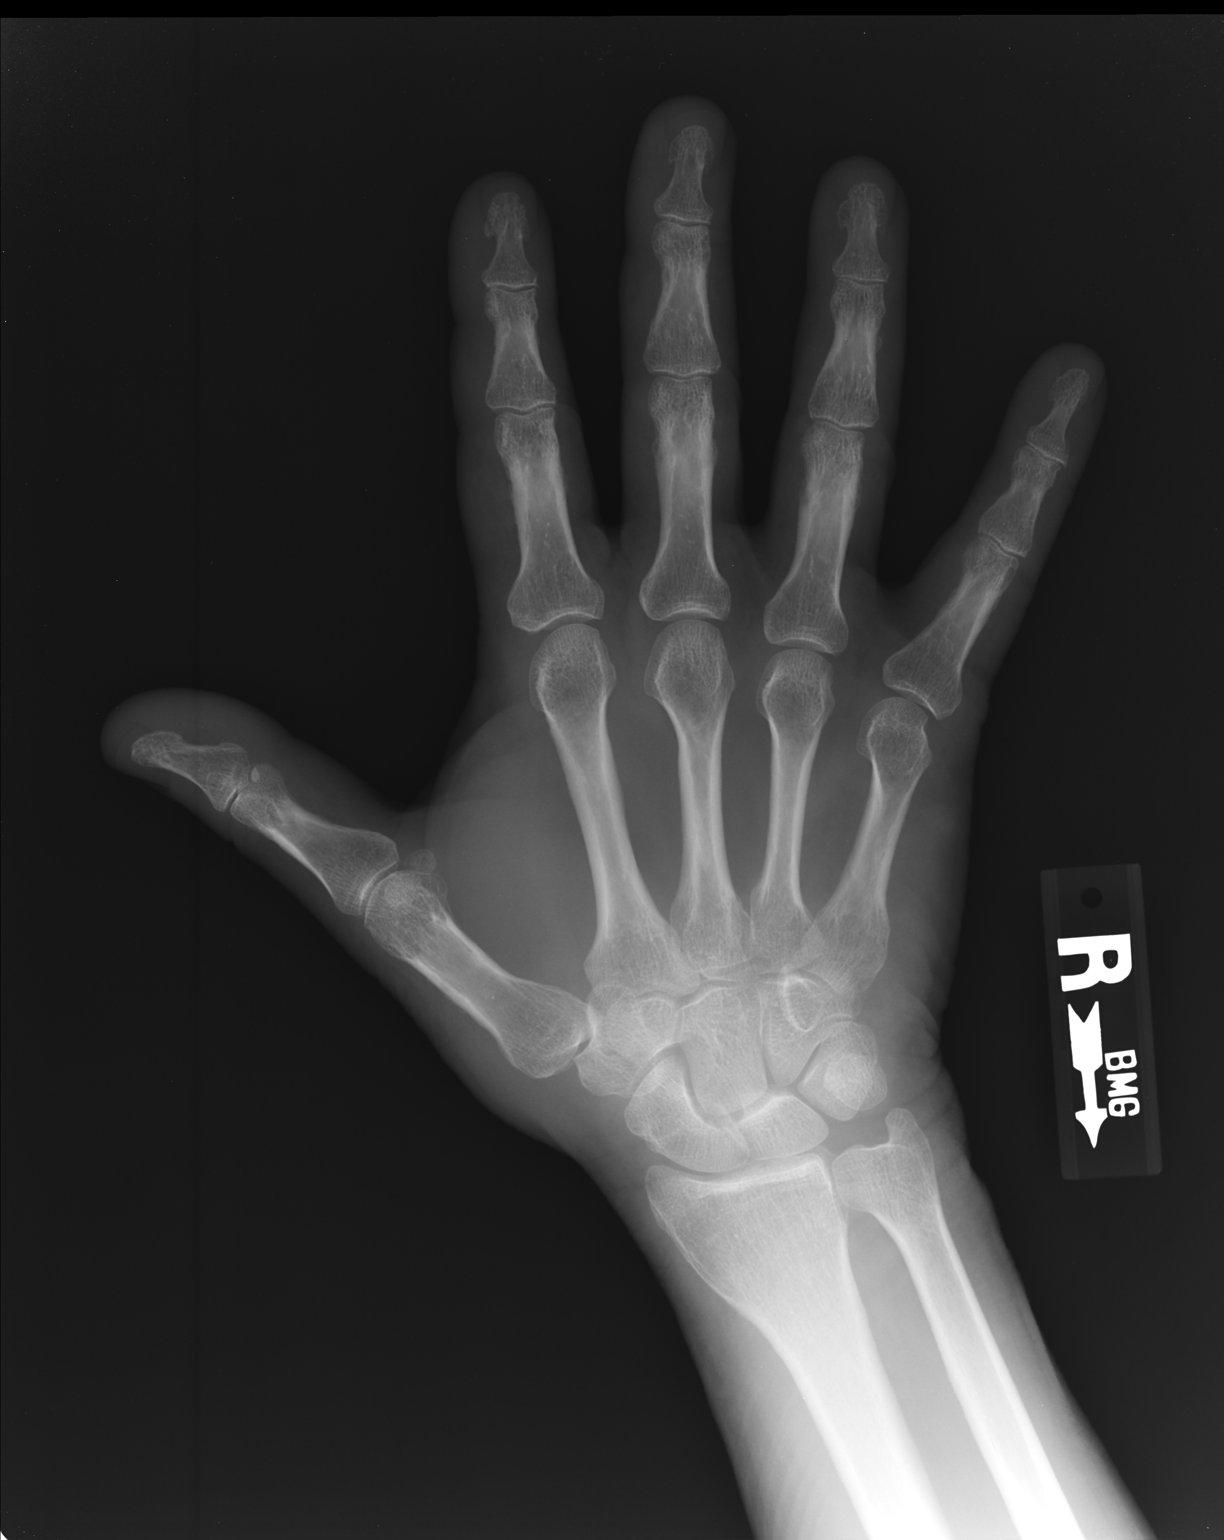

[pa obl]
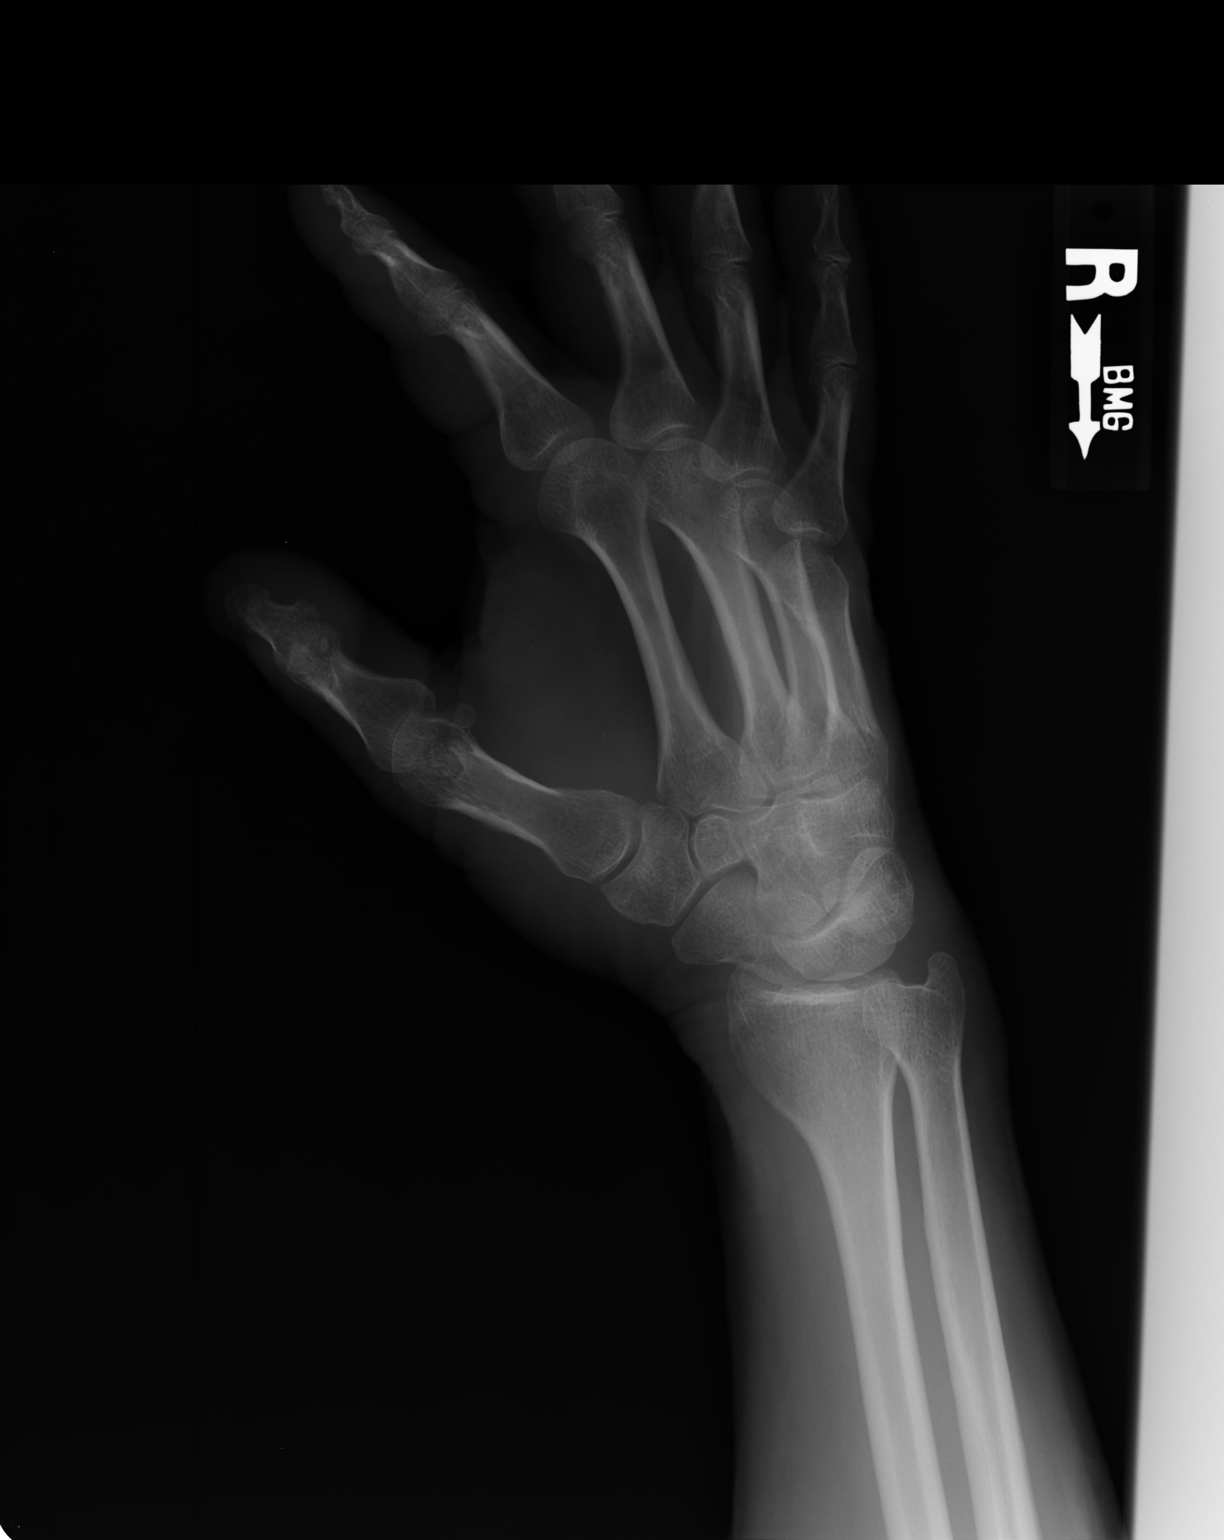

[lateral]
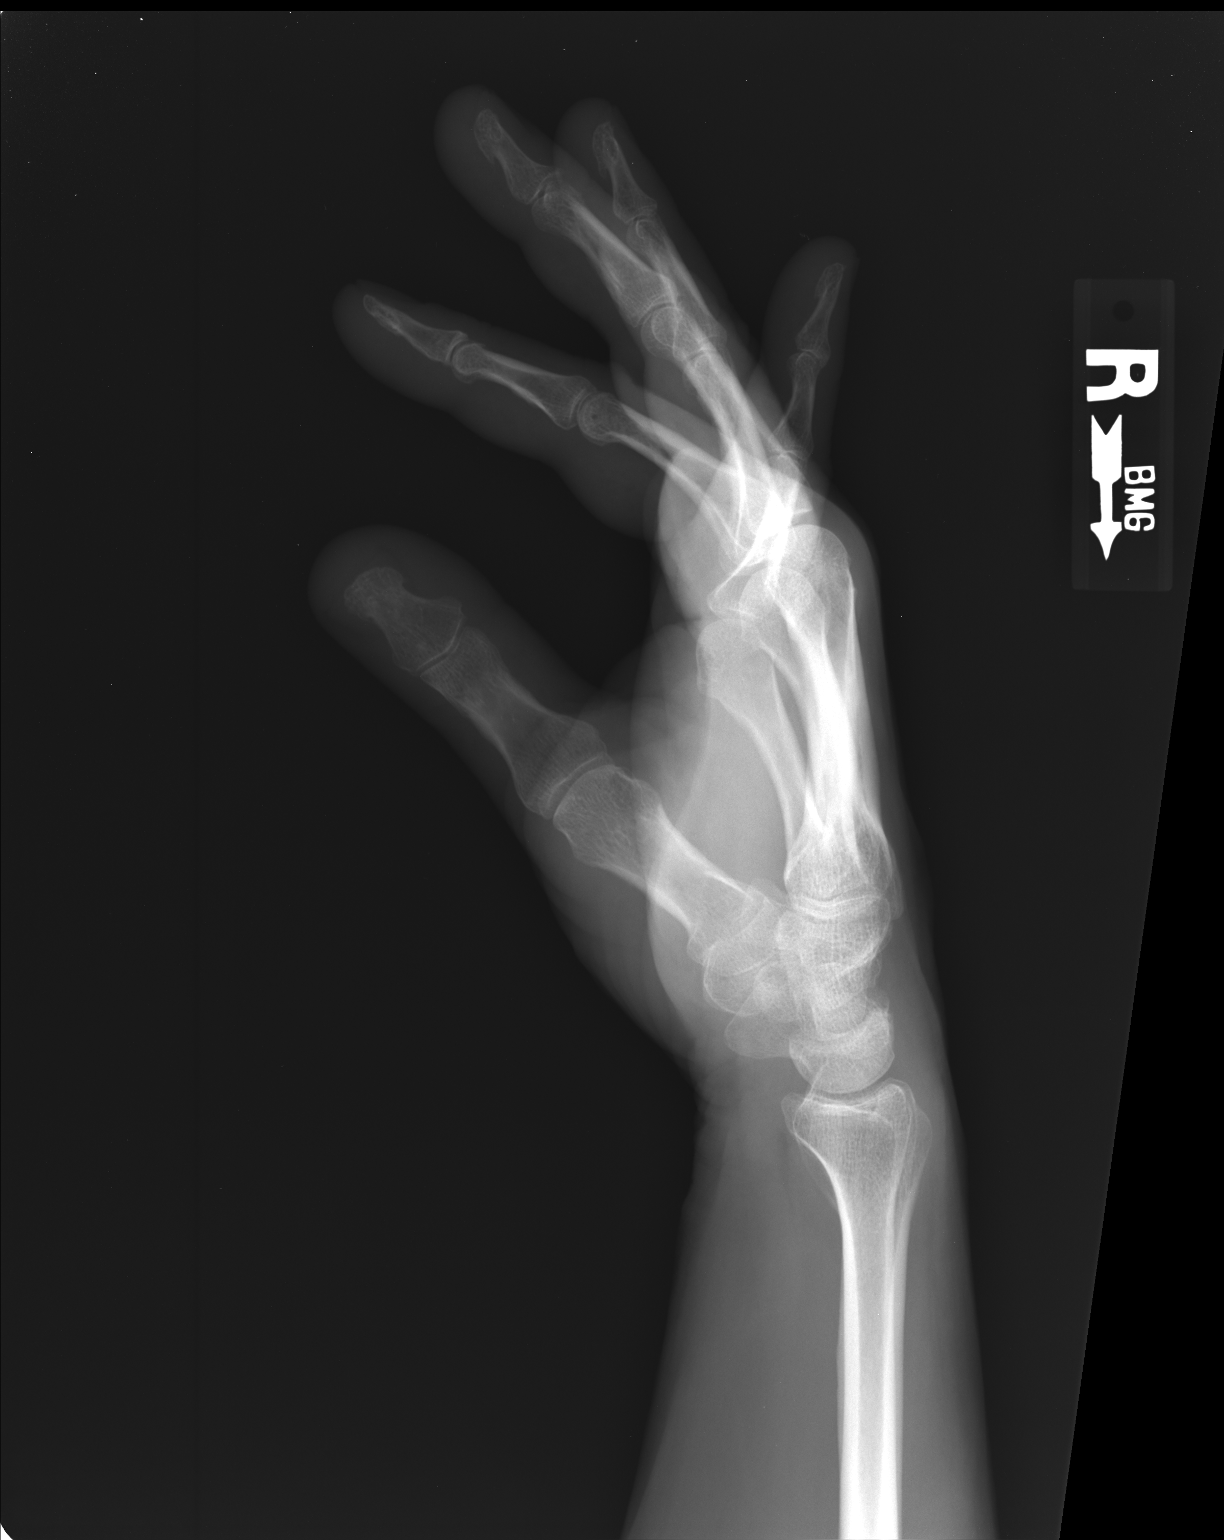

[3 of 3 positions shown; findings below may reference images not displayed]

FINDINGS: There is no evidence of fracture or dislocation. There is no
evidence of arthropathy or other focal bone abnormality. Soft
tissues are unremarkable.
IMPRESSION: Negative.

## 2017-01-04 ENCOUNTER — Other Ambulatory Visit: Payer: Self-pay | Admitting: Cardiovascular Disease

## 2017-07-10 ENCOUNTER — Ambulatory Visit: Payer: Medicare Other | Admitting: Family Medicine

## 2017-07-10 ENCOUNTER — Other Ambulatory Visit: Payer: Self-pay

## 2017-07-10 ENCOUNTER — Encounter: Payer: Self-pay | Admitting: Family Medicine

## 2017-07-10 VITALS — BP 110/62 | HR 85 | Temp 99.4°F | Resp 16 | Ht 70.0 in | Wt 159.0 lb

## 2017-07-10 DIAGNOSIS — R6889 Other general symptoms and signs: Secondary | ICD-10-CM

## 2017-07-10 LAB — POC INFLUENZA A&B (BINAX/QUICKVUE)
Influenza A, POC: NEGATIVE
Influenza B, POC: NEGATIVE

## 2017-07-10 MED ORDER — OSELTAMIVIR PHOSPHATE 75 MG PO CAPS: 75.0000 mg | ORAL_CAPSULE | Freq: Two times a day (BID) | ORAL | 0 refills | Status: DC

## 2017-07-10 NOTE — Progress Notes (Signed)
Subjective:    Patient ID: Blake Evans, male    DOB: 09-Oct-1950, 67 y.o.   MRN: [id number] Chief Complaint  Patient presents with  . Sore Throat    "wife diagnosed on Tues. with the flu  . body aches  . fever    101.7degrees  at 7:00 am    HPI   He felt yesterday morning, his sxs were coming and his wife was diagnosed with type flu A AND B.  Scrathy throat and body aches yesterday evening.  Throat not to bad now. No rhinitis. +cough and chest congestion. No CP, SHoB, palpitations.   Past Medical History:  Diagnosis Date  . Adenomatous colon polyp 2006  . Allergy    SEASONAL  . BPH (benign prostatic hyperplasia) 11/21/2011  . CAD (coronary artery disease), Residual disease of LAD and LCX 11/21/2011   myoview 01/09/12-positive bruce treadmill test with ventricular bigemeny, 2-3 mm horizontal ST segment depressions in II,III, AVF at peak exercise  . Hyperlipidemia 11/25/2011  . Kidney stone   . S/P angioplasty with stent, to LAD/Diag and AV groove, LCX.  11/24/11 11/25/2011  . STEMI (ST elevation myocardial infarction), ST elevation in inf. wall 11/21/2011   Past Surgical History:  Procedure Laterality Date  . CORONARY ANGIOPLASTY WITH STENT PLACEMENT  11/21/11   inferior wall MI; PCI and stenting to distal RCA, Resolute DES 3x81mm  . CORONARY ANGIOPLASTY WITH STENT PLACEMENT  11/24/11   distal LAD diag branch PCI and stenting with a resolute DES along with mid AV groove circ PCI with a resolute DES   . LEFT HEART CATHETERIZATION WITH CORONARY ANGIOGRAM N/A 11/21/2011   Procedure: LEFT HEART CATHETERIZATION WITH CORONARY ANGIOGRAM;  Surgeon: Lorretta Harp, MD;  Location: [facility] CATH LAB;  Service: Cardiovascular;  Laterality: N/A;  . NO PAST SURGERIES    . PERCUTANEOUS CORONARY STENT INTERVENTION (PCI-S)  11/21/2011   Procedure: PERCUTANEOUS CORONARY STENT INTERVENTION (PCI-S);  Surgeon: Lorretta Harp, MD;  Location: [facility] CATH LAB;  Service: Cardiovascular;;  . PERCUTANEOUS CORONARY  STENT INTERVENTION (PCI-S) N/A 11/24/2011   Procedure: PERCUTANEOUS CORONARY STENT INTERVENTION (PCI-S);  Surgeon: Lorretta Harp, MD;  Location: [facility] CATH LAB;  Service: Cardiovascular;  Laterality: N/A;   Current Outpatient Medications on File Prior to Visit  Medication Sig Dispense Refill  . aspirin 81 MG tablet Take 81 mg by mouth 2 (two) times daily.     Marland Kitchen atorvastatin (LIPITOR) 40 MG tablet TAKE 1 TABLET BY MOUTH DAILY AT 6:00 PM 90 tablet 2  . clopidogrel (PLAVIX) 75 MG tablet TAKE 1 TABLET BY MOUTH DAILY. 90 tablet 3  . finasteride (PROSCAR) 5 MG tablet Take 1 tablet by mouth daily.    Marland Kitchen lisinopril (PRINIVIL,ZESTRIL) 2.5 MG tablet TAKE 1 TABLET (2.5 MG TOTAL) BY MOUTH DAILY. 30 tablet 11  . Multiple Vitamin (MULTIVITAMIN) tablet Take 1 tablet by mouth daily.    . nitroGLYCERIN (NITROSTAT) 0.4 MG SL tablet Place 1 tablet (0.4 mg  total) under the tongue  every 5 (five) minutes x 3  doses as needed for chest  pain. 100 tablet 0  . saw palmetto 80 MG capsule Take 80 mg by mouth daily.    . fluorouracil (EFUDEX) 5 % cream APPLY ON THE SKIN TWICE A DAY ,PATIENT IS AWARE TO USE FOR 2 WEEKS ON AREAS TALKED ABOUT  0   No current facility-administered medications on file prior to visit.    No Known Allergies Family History  Problem Relation Age of  Onset  . Coronary artery disease Father   . Heart disease Father   . Hyperlipidemia Father   . Diabetes Mother   . Diabetes Sister    Social History   Socioeconomic History  . Marital status: Single    Spouse name: None  . Number of children: None  . Years of education: None  . Highest education level: None  Social Needs  . Financial resource strain: None  . Food insecurity - worry: None  . Food insecurity - inability: None  . Transportation needs - medical: None  . Transportation needs - non-medical: None  Occupational History  . None  Tobacco Use  . Smoking status: Never Smoker  . Smokeless tobacco: Never Used  Substance and  Sexual Activity  . Alcohol use: Yes    Alcohol/week: 0.0 oz    Comment: occasional  . Drug use: No  . Sexual activity: Yes    Birth control/protection: None  Other Topics Concern  . 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 Negative for unexpected weight change.  HENT: Positive for congestion, postnasal drip and sore throat. Negative for ear pain, rhinorrhea and sinus pressure.   Respiratory: Positive for cough. Negative for chest tightness, shortness of breath and wheezing.   Cardiovascular: Negative for chest pain and palpitations.  Gastrointestinal: Positive for nausea. Negative for abdominal pain, constipation, diarrhea and vomiting.  Genitourinary: Negative for dysuria.  Musculoskeletal: Positive for arthralgias and myalgias. Negative for gait problem, joint swelling and neck stiffness.  Skin: Negative for rash.  Allergic/Immunologic: Negative for immunocompromised state.  Neurological: Positive for headaches. Negative for syncope.  Hematological: Positive for adenopathy.  Psychiatric/Behavioral: Positive for sleep disturbance.       Objective:   Physical Exam  Constitutional: He is oriented to person, place, and time. He appears well-developed and well-nourished. He appears ill. No distress.  HENT:  Head: Normocephalic and atraumatic.  Right Ear: External ear and ear canal normal. Tympanic membrane is erythematous.  Left Ear: External ear and ear canal normal. Tympanic membrane is erythematous.  Nose: Mucosal edema and rhinorrhea present.  Mouth/Throat: Uvula is midline and  mucous membranes are normal. Posterior oropharyngeal erythema present. No oropharyngeal exudate or posterior oropharyngeal edema.  Eyes: Conjunctivae are normal. No scleral icterus.  Neck: Normal range of motion. Neck supple. No thyromegaly present.  Cardiovascular: Normal rate, regular rhythm, normal heart sounds and intact distal pulses.  Pulmonary/Chest: Effort normal and breath sounds normal. No respiratory distress.  Abdominal: Soft. Bowel sounds are normal. He exhibits no distension and no mass. There is no tenderness. There is no rebound and no guarding.  Musculoskeletal: He exhibits no edema.  Lymphadenopathy:    He has no cervical adenopathy.  Neurological: He is alert and oriented to person, place, and time.  Skin: Skin is warm and dry. He is not diaphoretic. No erythema.  Psychiatric: He has a normal mood and affect. His behavior is normal.      BP 110/62   Pulse 85   Temp  99.4 F (37.4 C) (Oral)   Resp 16   Ht 5\' 10"  (1.778 m)   Wt 159 lb (72.1 kg)   SpO2 96%   BMI 22.81 kg/m   Results for orders placed or performed in visit on 07/10/17  POC Influenza A&B (Binax test)  Result Value Ref Range   Influenza A, POC Negative Negative   Influenza B, POC Negative Negative       Assessment & Plan:   1. Flu-like symptoms   Very surprised test is negative considering his wife, who also had the flu shot, was seen here several days ago and diagnosed with double + type A and B influenza - pt admits he is not near as bad as his wife but sxs and exam c/w influenza and pt had temp of 101.7 this a.m.  As sxs just started 24-48 hrs prior, in window for antiviral.   Allternate tylenol/ibuprofen, push fluids, rest.  RTC if not improving in 4-5d.  Orders Placed This Encounter  Procedures  . POC Influenza A&B (Binax test)    Meds ordered this encounter  Medications  . oseltamivir (TAMIFLU) 75 MG capsule    Sig: Take 1 capsule (75 mg total) by mouth 2 (two) times daily.     Dispense:  10 capsule    Refill:  0     Delman Cheadle, M.D.  Primary Care at [facility] [address] [place], [place] [address] [phone] phone [phone] fax  07/12/17 6:03 AM

## 2017-07-10 NOTE — Patient Instructions (Addendum)
   IF you received an x-ray today, you will receive an invoice from [facility]. Please contact [facility] at [phone] with questions or concerns regarding your invoice.   IF you received labwork today, you will receive an invoice from [facility]. Please contact [facility] at [phone] with questions or concerns regarding your invoice.   Our billing staff will not be able to assist you with questions regarding bills from these companies.  You will be contacted with the lab results as soon as they are available. The fastest way to get your results is to activate your My Chart account. Instructions are located on the last page of this paperwork. If you have not heard from us regarding the results in 2 weeks, please contact this office.      Influenza, Adult Influenza, more commonly known as "the flu," is a viral infection that primarily affects the respiratory tract. The respiratory tract includes organs that help you breathe, such as the lungs, nose, and throat. The flu causes many common cold symptoms, as well as a high fever and body aches. The flu spreads easily from person to person (is contagious). Getting a flu shot (influenza vaccination) every year is the best way to prevent influenza. What are the causes? Influenza is caused by a virus. You can catch the virus by:  Breathing in droplets from an infected person's cough or sneeze.  Touching something that was recently contaminated with the virus and then touching your mouth, nose, or eyes.  What increases the risk? The following factors may make you more likely to get the flu:  Not cleaning your hands frequently with soap and water or alcohol-based hand sanitizer.  Having close contact with many people during cold and flu season.  Touching your mouth, eyes, or nose without washing or sanitizing your hands first.  Not drinking enough fluids or not eating a healthy diet.  Not getting enough sleep or  exercise.  Being under a high amount of stress.  Not getting a yearly (annual) flu shot.  You may be at a higher risk of complications from the flu, such as a severe lung infection (pneumonia), if you:  Are over the age of 65.  Are pregnant.  Have a weakened disease-fighting system (immune system). You may have a weakened immune system if you: ? Have HIV or AIDS. ? Are undergoing chemotherapy. ? Aretaking medicines that reduce the activity of (suppress) the immune system.  Have a long-term (chronic) illness, such as heart disease, kidney disease, diabetes, or lung disease.  Have a liver disorder.  Are obese.  Have anemia.  What are the signs or symptoms? Symptoms of this condition typically last 4-10 days and may include:  Fever.  Chills.  Headache, body aches, or muscle aches.  Sore throat.  Cough.  Runny or congested nose.  Chest discomfort and cough.  Poor appetite.  Weakness or tiredness (fatigue).  Dizziness.  Nausea or vomiting.  How is this diagnosed? This condition may be diagnosed based on your medical history and a physical exam. Your health care provider may do a nose or throat swab test to confirm the diagnosis. How is this treated? If influenza is detected early, you can be treated with antiviral medicine that can reduce the length of your illness and the severity of your symptoms. This medicine may be given by mouth (orally) or through an IV tube that is inserted in one of your veins. The goal of treatment is to relieve symptoms by taking   care of yourself at home. This may include taking over-the-counter medicines, drinking plenty of fluids, and adding humidity to the air in your home. In some cases, influenza goes away on its own. Severe influenza or complications from influenza may be treated in a hospital. Follow these instructions at home:  Take over-the-counter and prescription medicines only as told by your health care provider.  Use a  cool mist humidifier to add humidity to the air in your home. This can make breathing easier.  Rest as needed.  Drink enough fluid to keep your urine clear or pale yellow.  Cover your mouth and nose when you cough or sneeze.  Wash your hands with soap and water often, especially after you cough or sneeze. If soap and water are not available, use hand sanitizer.  Stay home from work or school as told by your health care provider. Unless you are visiting your health care provider, try to avoid leaving home until your fever has been gone for 24 hours without the use of medicine.  Keep all follow-up visits as told by your health care provider. This is important. How is this prevented?  Getting an annual flu shot is the best way to avoid getting the flu. You may get the flu shot in late summer, fall, or winter. Ask your health care provider when you should get your flu shot.  Wash your hands often or use hand sanitizer often.  Avoid contact with people who are sick during cold and flu season.  Eat a healthy diet, drink plenty of fluids, get enough sleep, and exercise regularly. Contact a health care provider if:  You develop new symptoms.  You have: ? Chest pain. ? Diarrhea. ? A fever.  Your cough gets worse.  You produce more mucus.  You feel nauseous or you vomit. Get help right away if:  You develop shortness of breath or difficulty breathing.  Your skin or nails turn a bluish color.  You have severe pain or stiffness in your neck.  You develop a sudden headache or sudden pain in your face or ear.  You cannot stop vomiting. This information is not intended to replace advice given to you by your health care provider. Make sure you discuss any questions you have with your health care provider. Document Released: 05/23/2000 Document Revised: 11/01/2015 Document Reviewed: 03/20/2015 Elsevier Interactive Patient Education  2017 Elsevier Inc.  

## 2017-07-28 LAB — PSA: PSA: 6.88

## 2017-08-06 ENCOUNTER — Encounter: Payer: Self-pay | Admitting: Family Medicine

## 2017-10-06 ENCOUNTER — Other Ambulatory Visit: Payer: Self-pay | Admitting: Cardiovascular Disease

## 2017-10-06 NOTE — Telephone Encounter (Signed)
Rx(s) sent to pharmacy electronically.  

## 2017-10-13 ENCOUNTER — Ambulatory Visit: Payer: Medicare Other | Admitting: Cardiovascular Disease

## 2017-10-13 ENCOUNTER — Encounter: Payer: Self-pay | Admitting: Cardiovascular Disease

## 2017-10-13 DIAGNOSIS — I10 Essential (primary) hypertension: Secondary | ICD-10-CM | POA: Diagnosis not present

## 2017-10-13 DIAGNOSIS — E7800 Pure hypercholesterolemia, unspecified: Secondary | ICD-10-CM | POA: Diagnosis not present

## 2017-10-13 DIAGNOSIS — I2111 ST elevation (STEMI) myocardial infarction involving right coronary artery: Secondary | ICD-10-CM | POA: Diagnosis not present

## 2017-10-13 NOTE — Progress Notes (Signed)
10/13/2017 Blake Evans   10-Aug-1950  [id number]  Primary Physician Nafziger, Tommi Rumps, NP Primary Cardiologist: Blake Harp MD Blake Evans, [place]  HPI:  Blake Evans is a 67 y.o.  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 I last saw him in the office  10/09/2016.His cardiovascular risk factor profile is positive for hyperlipidemia and a family history of a father that had bypass surgery. 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 I brought him to the cath lab at 2:00 in the morning and it demonstrated an occluded dominant distal RCA which I stented. He had circumflex and LAD diagonal branch disease as well. I brought him back 3 days later and in a staged fashion stented his distal LAD diagonal branch as well as his mid AV groove circumflex and he was discharged the following day. He feels well and has had no recurrent symptoms.  He exercises frequently without any limitation and actually rode his bicycle to his appointment this morning.  He has modified his diet and participated in cardiac rehab. Since I last saw him he denies chest pain or shortness of breath. Recent lipid profile performed 10/07/16 revealed total cholesterol 137, LDL 48 and HDL of 72     Current Meds  Medication Sig  . aspirin 81 MG tablet Take 81 mg by mouth 2 (two) times daily.   Marland Kitchen atorvastatin (LIPITOR) 40 MG tablet TAKE 1 TABLET BY MOUTH DAILY AT 6:00 PM  . clopidogrel (PLAVIX) 75 MG tablet TAKE 1 TABLET BY MOUTH DAILY.  . finasteride (PROSCAR) 5 MG tablet Take 1 tablet by mouth daily.  . fluorouracil (EFUDEX) 5 % cream APPLY ON THE SKIN TWICE A DAY ,PATIENT IS AWARE TO USE FOR 2 WEEKS ON AREAS TALKED ABOUT  . lisinopril (PRINIVIL,ZESTRIL) 2.5 MG tablet TAKE 1 TABLET (2.5 MG TOTAL) BY MOUTH DAILY.  . Multiple Vitamin (MULTIVITAMIN) tablet Take 1  tablet by mouth daily.  . nitroGLYCERIN (NITROSTAT) 0.4 MG SL tablet Place 1 tablet (0.4 mg  total) under the tongue  every 5 (five) minutes x 3  doses as needed for chest  pain.  . saw palmetto 80 MG capsule Take 80 mg by mouth daily.  . [DISCONTINUED] oseltamivir (TAMIFLU) 75 MG capsule Take 1 capsule (75 mg total) by mouth 2 (two) times daily.     No Known Allergies  Social History   Socioeconomic History  . Marital status: Single    Spouse name: Not on file  . Number of children: Not on file  . Years of education: Not on file  . Highest education level: Not on file  Occupational History  . Not on file  Social Needs  . Financial resource strain: Not on file  . Food insecurity:    Worry: Not on file    Inability: Not on file  . Transportation needs:    Medical: Not on file    Non-medical: Not on file  Tobacco Use  . Smoking status: Never Smoker  . Smokeless tobacco: Never Used  Substance and Sexual Activity  . Alcohol use: Yes    Alcohol/week: 0.0 oz    Comment: occasional  . Drug use: No  . Sexual activity: Yes    Birth control/protection: None  Lifestyle  . Physical activity:    Days per week: Not on file  Minutes per session: Not on file  . Stress: Not on file  Relationships  . Social connections:    Talks on phone: Not on file    Gets together: Not on file    Attends religious service: Not on file    Active member of club or organization: Not on file    Attends meetings of clubs or organizations: Not on file    Relationship status: Not on file  . Intimate partner violence:    Fear of current or ex partner: Not on file    Emotionally abused: Not on file    Physically abused: Not on file    Forced sexual activity: Not on file  Other Topics Concern  . Not on file  Social History Narrative   Retired as a Optometrist in [employer]   Married x 8 months   Two grown children - both live in [place]         Review of Systems: General: negative for  chills, fever, night sweats or weight changes.  Cardiovascular: negative for chest pain, dyspnea on exertion, edema, orthopnea, palpitations, paroxysmal nocturnal dyspnea or shortness of breath Dermatological: negative for rash Respiratory: negative for cough or wheezing Urologic: negative for hematuria Abdominal: negative for nausea, vomiting, diarrhea, bright red blood per rectum, melena, or hematemesis Neurologic: negative for visual changes, syncope, or dizziness All other systems reviewed and are otherwise negative except as noted above.    Blood pressure 121/71, pulse 71, height 5\' 11"  (1.803 m), weight 159 lb 12.8 oz (72.5 kg).  General appearance: alert and no distress Neck: no adenopathy, no carotid bruit, no JVD, supple, symmetrical, trachea midline and thyroid not enlarged, symmetric, no tenderness/mass/nodules Lungs: clear to auscultation bilaterally Heart: regular rate and rhythm, S1, S2 normal, no murmur, click, rub or gallop Extremities: extremities normal, atraumatic, no cyanosis or edema Pulses: 2+ and symmetric Skin: Skin color, texture, turgor normal. No rashes or lesions Neurologic: Alert and oriented X 3, normal strength and tone. Normal symmetric reflexes. Normal coordination and gait  EKG sinus rhythm at 71 without ST or T wave changes.  I personally reviewed this EKG.  ASSESSMENT AND PLAN:   Hyperlipidemia History of hyperlipidemia on statin therapy.  We will recheck a lipid and liver profile  Essential hypertension History of essential hypertension her blood pressure measured today at 121/71.  Is on lisinopril.  Continue current meds at current dosing.  STEMI (ST elevation myocardial infarction), ST elevation in inf. wall, s/p DES resolute to RCA 11/21/11 History of CAD status post STEMI 11/21/2011 at 2:00 in the morning.  I opened up his occluded dominant RCA and several days later brought him back for staged LAD, diagonal and circumflex intervention.  He is  done well since without symptoms.  He remains on dual antiplatelet therapy and is fairly active.  In fact, he rode his bicycle to the appointment this morning.      Blake Harp MD FACP,FACC,FAHA, [facility] 10/13/2017 8:59 AM

## 2017-10-13 NOTE — Assessment & Plan Note (Signed)
History of hyperlipidemia on statin therapy. We will recheck a lipid and liver profile 

## 2017-10-13 NOTE — Patient Instructions (Signed)

## 2017-10-13 NOTE — Assessment & Plan Note (Signed)
History of essential hypertension her blood pressure measured today at 121/71.  Is on lisinopril.  Continue current meds at current dosing.

## 2017-10-13 NOTE — Assessment & Plan Note (Signed)
History of CAD status post STEMI 11/21/2011 at 2:00 in the morning.  I opened up his occluded dominant RCA and several days later brought him back for staged LAD, diagonal and circumflex intervention.  He is done well since without symptoms.  He remains on dual antiplatelet therapy and is fairly active.  In fact, he rode his bicycle to the appointment this morning.

## 2017-10-24 ENCOUNTER — Other Ambulatory Visit: Payer: Self-pay | Admitting: Cardiovascular Disease

## 2017-10-26 NOTE — Telephone Encounter (Signed)
Rx has been sent to the pharmacy electronically. ° °

## 2017-10-29 LAB — HEPATIC FUNCTION PANEL
ALT: 19 IU/L (ref 0–44)
AST: 21 IU/L (ref 0–40)
Albumin: 4.3 g/dL (ref 3.6–4.8)
Alkaline Phosphatase: 61 IU/L (ref 39–117)
Bilirubin Total: 1.4 mg/dL — ABNORMAL HIGH (ref 0.0–1.2)
Bilirubin, Direct: 0.31 mg/dL (ref 0.00–0.40)
Total Protein: 6.7 g/dL (ref 6.0–8.5)

## 2017-10-29 LAB — LIPID PANEL
Chol/HDL Ratio: 2.2 ratio (ref 0.0–5.0)
Cholesterol, Total: 141 mg/dL (ref 100–199)
HDL: 65 mg/dL (ref >39)
LDL Calculated: 62 mg/dL (ref 0–99)
Triglycerides: 72 mg/dL (ref 0–149)
VLDL Cholesterol Cal: 14 mg/dL (ref 5–40)

## 2017-10-30 ENCOUNTER — Encounter: Payer: Self-pay | Admitting: *Deleted

## 2017-11-09 ENCOUNTER — Other Ambulatory Visit: Payer: Self-pay | Admitting: Cardiovascular Disease

## 2017-11-10 NOTE — Telephone Encounter (Signed)
Rx sent to pharmacy   

## 2018-01-08 ENCOUNTER — Other Ambulatory Visit: Payer: Self-pay | Admitting: Cardiovascular Disease

## 2018-02-22 ENCOUNTER — Ambulatory Visit: Payer: Medicare Other | Admitting: Urgent Care

## 2018-02-22 ENCOUNTER — Encounter: Payer: Self-pay | Admitting: Urgent Care

## 2018-02-22 ENCOUNTER — Ambulatory Visit (INDEPENDENT_AMBULATORY_CARE_PROVIDER_SITE_OTHER): Payer: Medicare Other

## 2018-02-22 VITALS — BP 119/70 | HR 67 | Temp 97.8°F | Resp 16 | Ht 71.0 in | Wt 158.0 lb

## 2018-02-22 DIAGNOSIS — M79671 Pain in right foot: Secondary | ICD-10-CM

## 2018-02-22 DIAGNOSIS — S92514A Nondisplaced fracture of proximal phalanx of right lesser toe(s), initial encounter for closed fracture: Secondary | ICD-10-CM

## 2018-02-22 DIAGNOSIS — S99921A Unspecified injury of right foot, initial encounter: Secondary | ICD-10-CM

## 2018-02-22 NOTE — Patient Instructions (Addendum)
You may take 500mg  Tylenol every 6 hours for pain and inflammation.     Toe Fracture A toe fracture is a break in one of the toe bones (phalanges). What are the causes? This condition may be caused by:  Dropping a heavy object on your toe.  Stubbing your toe.  Overusing your toe or doing repetitive exercise.  Twisting or stretching your toe out of place.  What increases the risk? This condition is more likely to develop in people who:  Play contact sports.  Have a bone disease.  Have a low calcium level.  What are the signs or symptoms? The main symptoms of this condition are swelling and pain in the toe. The pain may get worse with standing or walking. Other symptoms include:  Bruising.  Stiffness.  Numbness.  A change in the way the toe looks.  Broken bones that poke through the skin.  Blood beneath the toenail.  How is this diagnosed? This condition is diagnosed with a physical exam. You may also have X-rays. How is this treated? Treatment for this condition depends on the type of fracture and its severity. Treatment may involve:  Taping the broken toe to a toe that is next to it (buddy taping). This is the most common treatment for fractures in which the bone has not moved out of place (nondisplaced fracture).  Wearing a shoe that has a wide, rigid sole to protect the toe and to limit its movement.  Wearing a walking cast.  Having a procedure to move the toe back into place.  Surgery. This may be needed: ? If there are many pieces of broken bone that are out of place (displaced). ? If the toe joint breaks. ? If the bone breaks through the skin.  Physical therapy. This is done to help regain movement and strength in the toe.  You may need follow-up X-rays to make sure that the bone is healing well and staying in position. Follow these instructions at home: If you have a cast:  Do not stick anything inside the cast to scratch your skin. Doing that  increases your risk of infection.  Check the skin around the cast every day. Report any concerns to your health care provider. You may put lotion on dry skin around the edges of the cast. Do not apply lotion to the skin underneath the cast.  Do not put pressure on any part of the cast until it is fully hardened. This may take several hours.  Keep the cast clean and dry. Bathing  Do not take baths, swim, or use a hot tub until your health care provider approves. Ask your health care provider if you can take showers. You may only be allowed to take sponge baths for bathing.  If your health care provider approves bathing and showering, cover the cast or bandage (dressing) with a watertight plastic bag to protect it from water. Do not let the cast or dressing get wet. Managing pain, stiffness, and swelling  If you do not have a cast, apply ice to the injured area, if directed. ? Put ice in a plastic bag. ? Place a towel between your skin and the bag. ? Leave the ice on for 20 minutes, 2-3 times per day.  Move your toes often to avoid stiffness and to lessen swelling.  Raise (elevate) the injured area above the level of your heart while you are sitting or lying down. Driving  Do not drive or operate heavy machinery while  taking pain medicine.  Do not drive while wearing a cast on a foot that you use for driving. Activity  Return to your normal activities as directed by your health care provider. Ask your health care provider what activities are safe for you.  Perform exercises daily as directed by your health care provider or physical therapist. Safety  Do not use the injured limb to support your body weight until your health care provider says that you can. Use crutches or other assistive devices as directed by your health care provider. General instructions  If your toe was treated with buddy taping, follow your health care provider's instructions for changing the gauze and tape.  Change it more often: ? The gauze and tape get wet. If this happens, dry the space between the toes. ? The gauze and tape are too tight and cause your toe to become pale or numb.  Wear a protective shoe as directed by your health care provider. If you were not given a protective shoe, wear sturdy, supportive shoes. Your shoes should not pinch your toes and should not fit tightly against your toes.  Do not use any tobacco products, including cigarettes, chewing tobacco, or e-cigarettes. Tobacco can delay bone healing. If you need help quitting, ask your health care provider.  Take medicines only as directed by your health care provider.  Keep all follow-up visits as directed by your health care provider. This is important. Contact a health care provider if:  You have a fever.  Your pain medicine is not helping.  Your toe is cold.  Your toe is numb.  You still have pain after one week of rest and treatment.  You still have pain after your health care provider has said that you can start walking again.  You have pain, tingling, or numbness in your foot that is not going away. Get help right away if:  You have severe pain.  You have redness or inflammation in your toe that is getting worse.  You have pain or numbness in your toe that is getting worse.  Your toe turns blue. This information is not intended to replace advice given to you by your health care provider. Make sure you discuss any questions you have with your health care provider. Document Released: 05/23/2000 Document Revised: 01/28/2016 Document Reviewed: 03/22/2014 Elsevier Interactive Patient Education  [employer].     If you have lab work done today you will be contacted with your lab results within the next 2 weeks.  If you have not heard from [place] then please contact us. The fastest way to get your results is to register for My Chart.   IF you received an x-ray today, you will receive an invoice from  [facility]. Please contact [facility] at [phone] with questions or concerns regarding your invoice.   IF you received labwork today, you will receive an invoice from [facility]. Please contact [facility] at [phone] with questions or concerns regarding your invoice.   Our billing staff will not be able to assist you with questions regarding bills from these companies.  You will be contacted with the lab results as soon as they are available. The fastest way to get your results is to activate your My Chart account. Instructions are located on the last page of this paperwork. If you have not heard from [place] regarding the results in 2 weeks, please contact this office.

## 2018-02-22 NOTE — Progress Notes (Signed)
    MRN: [id number] DOB: [birth year]  Subjective:   Blake Evans is a 67 y.o. male presenting for 2-week history of persistent right foot pain.  Pain started after he was walking in the basement and his toe got pulled up.  He has since had pain over the dorsal aspect of distal foot.  Patient is also running, very active.  He has cut back on his running some but is still very active on his feet.  He is on clopidogrel and is not able to take NSAIDs due to his medical history.  Denies history of Morton's neuroma, gout.  Hernan has a current medication list which includes the following prescription(s): aspirin, atorvastatin, clopidogrel, finasteride, fluorouracil, lisinopril, multivitamin, nitroglycerin, and saw palmetto. Also has No Known Allergies.  Blake Evans  has a past medical history of Adenomatous colon polyp (2006), Allergy, BPH (benign prostatic hyperplasia) (11/21/2011), CAD (coronary artery disease), Residual disease of LAD and LCX (11/21/2011), Hyperlipidemia (11/25/2011), Kidney stone, S/P angioplasty with stent, to LAD/Diag and AV groove, LCX.  11/24/11 (11/25/2011), and STEMI (ST elevation myocardial infarction), ST elevation in inf. wall (11/21/2011). Also  has a past surgical history that includes No past surgeries; Coronary angioplasty with stent (11/21/11); Coronary angioplasty with stent (11/24/11); left heart catheterization with coronary angiogram (N/A, 11/21/2011); percutaneous coronary stent intervention (pci-s) (11/21/2011); and percutaneous coronary stent intervention (pci-s) (N/A, 11/24/2011).  Objective:   Vitals: BP 119/70   Pulse 67   Temp 97.8 F (36.6 C) (Oral)   Resp 16   Ht 5\' 11"  (1.803 m)   Wt 158 lb (71.7 kg)   SpO2 97%   BMI 22.04 kg/m   Physical Exam  Constitutional: He is oriented to person, place, and time. He appears well-developed and well-nourished.  Cardiovascular: Normal rate.  Pulmonary/Chest: Effort normal.  Musculoskeletal:       Right foot: There is  decreased range of motion, tenderness (over area depicted), bony tenderness and swelling (trace of 4th toe). There is normal capillary refill, no crepitus, no deformity and no laceration.       Feet:  Neurological: He is alert and oriented to person, place, and time.    Dg Foot Complete Right  Result Date: 02/22/2018 CLINICAL DATA:  Injury 2 weeks ago.  Right foot pain EXAM: RIGHT FOOT COMPLETE - 3+ VIEW COMPARISON:  None. FINDINGS: Probable old healed 5th metatarsal fracture. Probable acute fracture noted in the proximal phalanx of the right 4th toe, not significantly displaced. No subluxation or dislocation. Soft tissues are intact. IMPRESSION: Acute right 4th toe proximal phalangeal fracture. Not significantly displaced. Electronically Signed   By: Rolm Baptise M.D.   On: 02/22/2018 12:13     Assessment and Plan :   Foot pain, right - Plan: DG Foot Complete Right  Right foot injury, initial encounter - Plan: DG Foot Complete Right  Closed nondisplaced fracture of proximal phalanx of lesser toe of right foot, initial encounter - Plan: Ambulatory referral to Orthopedic Surgery  Commended modification of activities.  Will use buddy tape system together with hard soled shoe.  Patient is to schedule Tylenol for pain and inflammation.  Refer to Ortho pending.  Jaynee Eagles, PA-C [facility] [phone] 02/22/2018  11:56 AM

## 2018-03-08 ENCOUNTER — Ambulatory Visit (INDEPENDENT_AMBULATORY_CARE_PROVIDER_SITE_OTHER): Payer: Medicare Other | Admitting: Orthopaedic Surgery

## 2018-03-08 ENCOUNTER — Encounter (INDEPENDENT_AMBULATORY_CARE_PROVIDER_SITE_OTHER): Payer: Self-pay | Admitting: Orthopaedic Surgery

## 2018-03-08 VITALS — BP 138/70 | HR 59 | Ht 71.0 in | Wt 160.0 lb

## 2018-03-08 DIAGNOSIS — S92514A Nondisplaced fracture of proximal phalanx of right lesser toe(s), initial encounter for closed fracture: Secondary | ICD-10-CM | POA: Diagnosis not present

## 2018-03-08 NOTE — Progress Notes (Signed)
Office Visit Note   Patient: Blake Evans           Date of Birth: 06-18-1950           MRN: [id number] Visit Date: 03/08/2018              Requested by: Jaynee Eagles, PA-C [place], [place] [address] PCP: Dorothyann Peng, NP   Assessment & Plan: Visit Diagnoses:  1. Closed nondisplaced fracture of proximal phalanx of lesser toe of right foot, initial encounter     Plan: Mr. Metayer kicked a shop vac on 4 September injuring his right fourth toe.  Seen by his primary care physician on the 16th with persistent pain.  X-rays consistent with a nondisplaced fracture of the proximal phalanx of the right fourth toe.  Placed in a wooden shoe.  Presently comfortable shoewear to comfort. return as needed  Follow-Up Instructions: Return if symptoms worsen or fail to improve.   Orders:  No orders of the defined types were placed in this encounter.  No orders of the defined types were placed in this encounter.     Procedures: No procedures performed   Clinical Data: No additional findings.   Subjective: Chief Complaint  Patient presents with  . New Patient (Initial Visit)    R 4TH TOE FX 4 WEEKS AGO, KICKED SHOP VAC IN BASEMENT 4 WEEKS AGO WENT TO [facility] 2 WEEKS AGO  Diagnosed with a nondisplaced fracture at the waist of the proximal phalanx right fourth toe.  Placed in a wooden shoe.  Presently comfortable.  Able to play golf. Of his right foot on the PACS system from September 16 there is a nondisplaced fracture through the waist of the proximal phalanx right fourth toe.  Appears to have an old fracture of the little metatarsal with good healing   HPI  Review of Systems  Constitutional: Negative for fatigue and fever.  HENT: Negative for ear pain.   Eyes: Negative for pain.  Respiratory: Negative for cough and shortness of breath.   Cardiovascular: Negative for leg swelling.  Gastrointestinal: Negative for constipation and diarrhea.  Genitourinary:  Positive for difficulty urinating.  Musculoskeletal: Negative for back pain and neck pain.  Skin: Negative for rash.  Allergic/Immunologic: Negative for food allergies.  Neurological: Positive for weakness. Negative for numbness.  Hematological: Bruises/bleeds easily.  Psychiatric/Behavioral: Negative for sleep disturbance.     Objective: Vital Signs: BP 138/70 (BP Location: Left Arm, Patient Position: Sitting, Cuff Size: Normal)   Pulse (!) 59   Ht 5\' 11"  (1.803 m)   Wt 160 lb (72.6 kg)   BMI 22.32 kg/m   Physical Exam  Constitutional: He is oriented to person, place, and time. He appears well-developed and well-nourished.  HENT:  Mouth/Throat: Oropharynx is clear and moist.  Eyes: Pupils are equal, round, and reactive to light. EOM are normal.  Pulmonary/Chest: Effort normal.  Neurological: He is alert and oriented to person, place, and time.  Skin: Skin is warm and dry.  Psychiatric: He has a normal mood and affect. His behavior is normal.    Ortho Exam awake alert and oriented x3.  Comfortable sitting.  No limp walking in a wooden shoe right foot.  Mild swelling of the fourth toe and minimal pain at the base of the fourth toe.  No deformity.  Good capillary refill.  Normal sensation.  Specialty Comments:  No specialty comments available.  Imaging: No results found.   PMFS History: Patient Active  Problem List   Diagnosis Date Noted  . Essential hypertension 08/03/2015  . Hearing loss 03/17/2012  . S/P angioplasty with stent, to LAD/Diag and AV groove, LCX.  11/24/11 11/25/2011  . Hyperlipidemia 11/25/2011  . STEMI (ST elevation myocardial infarction), ST elevation in inf. wall, s/p DES resolute to RCA 11/21/11 11/21/2011  . BPH (benign prostatic hyperplasia) 11/21/2011  . CAD (coronary artery disease), Residual disease of LAD and LCX 11/21/2011   Past Medical History:  Diagnosis Date  . Adenomatous colon polyp 2006  . Allergy    SEASONAL  . BPH (benign prostatic  hyperplasia) 11/21/2011  . CAD (coronary artery disease), Residual disease of LAD and LCX 11/21/2011   myoview 01/09/12-positive bruce treadmill test with ventricular bigemeny, 2-3 mm horizontal ST segment depressions in II,III, AVF at peak exercise  . Hyperlipidemia 11/25/2011  . Kidney stone   . S/P angioplasty with stent, to LAD/Diag and AV groove, LCX.  11/24/11 11/25/2011  . STEMI (ST elevation myocardial infarction), ST elevation in inf. wall 11/21/2011    Family History  Problem Relation Age of Onset  . Coronary artery disease Father   . Heart disease Father   . Hyperlipidemia Father   . Diabetes Mother   . Diabetes Sister     Past Surgical History:  Procedure Laterality Date  . CORONARY ANGIOPLASTY WITH STENT PLACEMENT  11/21/11   inferior wall MI; PCI and stenting to distal RCA, Resolute DES 3x70mm  . CORONARY ANGIOPLASTY WITH STENT PLACEMENT  11/24/11   distal LAD diag branch PCI and stenting with a resolute DES along with mid AV groove circ PCI with a resolute DES   . LEFT HEART CATHETERIZATION WITH CORONARY ANGIOGRAM N/A 11/21/2011   Procedure: LEFT HEART CATHETERIZATION WITH CORONARY ANGIOGRAM;  Surgeon: Lorretta Harp, MD;  Location[facility] CATH LAB;  Service: Cardiovascular;  Laterality: N/A;  . NO PAST SURGERIES    . PERCUTANEOUS CORONARY STENT INTERVENTION (PCI-S)  11/21/2011   Procedure: PERCUTANEOUS CORONARY STENT INTERVENTION (PCI-S);  Surgeon: Lorretta Harp, MD;  Location[facility] CATH LAB;  Service: Cardiovascular;;  . PERCUTANEOUS CORONARY STENT INTERVENTION (PCI-S) N/A 11/24/2011   Procedure: PERCUTANEOUS CORONARY STENT INTERVENTION (PCI-S);  Surgeon: Lorretta Harp, MD;  Location: [facility] CATH LAB;  Service: Cardiovascular;  Laterality: N/A;   Social History   Occupational History  . Not on file  Tobacco Use  . Smoking status: Never Smoker  . Smokeless tobacco: Never Used  Substance and Sexual Activity  . Alcohol use: Yes    Alcohol/week: 0.0 standard drinks    Comment:  occasional  . Drug use: No  . Sexual activity: Yes    Birth control/protection: None

## 2018-05-03 ENCOUNTER — Telehealth: Payer: Self-pay | Admitting: Family Medicine

## 2018-05-03 MED ORDER — AZITHROMYCIN 250 MG PO TABS: ORAL_TABLET | ORAL | 0 refills | Status: DC

## 2018-05-03 NOTE — Telephone Encounter (Signed)
Seen in office today with his wife who is my pt. She got her current sinus infxn from him. He is still ill as well without any improvement x 6d.  Sent in zpack. No decongestants Try mucinex, delsym, OR mucinex DM

## 2018-09-15 ENCOUNTER — Encounter: Payer: Self-pay | Admitting: Adult Health

## 2018-09-15 ENCOUNTER — Other Ambulatory Visit: Payer: Self-pay

## 2018-09-15 ENCOUNTER — Ambulatory Visit (INDEPENDENT_AMBULATORY_CARE_PROVIDER_SITE_OTHER): Payer: Medicare Other | Admitting: Adult Health

## 2018-09-15 DIAGNOSIS — W57XXXA Bitten or stung by nonvenomous insect and other nonvenomous arthropods, initial encounter: Secondary | ICD-10-CM | POA: Diagnosis not present

## 2018-09-15 DIAGNOSIS — S20362A Insect bite (nonvenomous) of left front wall of thorax, initial encounter: Secondary | ICD-10-CM | POA: Diagnosis not present

## 2018-09-15 NOTE — Progress Notes (Signed)
Virtual Visit via Video Note  I connected with Blake Evans on 09/15/18 at 10:00 AM EDT by a video enabled telemedicine application and verified that I am speaking with the correct person using two identifiers.  Location patient: home Location provider:work or home office Persons participating in the virtual visit: patient, provider  I discussed the limitations of evaluation and management by telemedicine and the availability of in person appointments. The patient expressed understanding and agreed to proceed.   HPI: 68 year old male who is being evaluated today for a tick bite to left shoulder.  Reports Monday he noticed a tick that was attached for 1 to 5 hours after working outside.  He was able to remove the tick in its entirety.  Reports yesterday the bite "puffed up" today the swelling hives have subsided.  He does not notice any bull's-eye rash, denies fevers, chills, or body ache.   ROS: See pertinent positives and negatives per HPI.  Past Medical History:  Diagnosis Date  . Adenomatous colon polyp 2006  . Allergy    SEASONAL  . BPH (benign prostatic hyperplasia) 11/21/2011  . CAD (coronary artery disease), Residual disease of LAD and LCX 11/21/2011   myoview 01/09/12-positive bruce treadmill test with ventricular bigemeny, 2-3 mm horizontal ST segment depressions in II,III, AVF at peak exercise  . Hyperlipidemia 11/25/2011  . Kidney stone   . S/P angioplasty with stent, to LAD/Diag and AV groove, LCX.  11/24/11 11/25/2011  . STEMI (ST elevation myocardial infarction), ST elevation in inf. wall 11/21/2011    Past Surgical History:  Procedure Laterality Date  . CORONARY ANGIOPLASTY WITH STENT PLACEMENT  11/21/11   inferior wall MI; PCI and stenting to distal RCA, Resolute DES 3x51mm  . CORONARY ANGIOPLASTY WITH STENT PLACEMENT  11/24/11   distal LAD diag branch PCI and stenting with a resolute DES along with mid AV groove circ PCI with a resolute DES   . LEFT HEART CATHETERIZATION  WITH CORONARY ANGIOGRAM N/A 11/21/2011   Procedure: LEFT HEART CATHETERIZATION WITH CORONARY ANGIOGRAM;  Surgeon: Lorretta Harp, MD;  Location: [facility] CATH LAB;  Service: Cardiovascular;  Laterality: N/A;  . NO PAST SURGERIES    . PERCUTANEOUS CORONARY STENT INTERVENTION (PCI-S)  11/21/2011   Procedure: PERCUTANEOUS CORONARY STENT INTERVENTION (PCI-S);  Surgeon: Lorretta Harp, MD;  Location: [facility] CATH LAB;  Service: Cardiovascular;;  . PERCUTANEOUS CORONARY STENT INTERVENTION (PCI-S) N/A 11/24/2011   Procedure: PERCUTANEOUS CORONARY STENT INTERVENTION (PCI-S);  Surgeon: Lorretta Harp, MD;  Location: [facility] CATH LAB;  Service: Cardiovascular;  Laterality: N/A;    Family History  Problem Relation Age of Onset  . Coronary artery disease Father   . Heart disease Father   . Hyperlipidemia Father   . Diabetes Mother   . Diabetes Sister      Current Outpatient Medications:  .  aspirin 81 MG tablet, Take 81 mg by mouth 2 (two) times daily. , Disp: , Rfl:  .  atorvastatin (LIPITOR) 40 MG tablet, TAKE 1 TABLET BY MOUTH DAILY AT 6:00 PM, Disp: 90 tablet, Rfl: 3 .  azithromycin (ZITHROMAX) 250 MG tablet, Take 2 tabs PO x 1 dose, then 1 tab PO QD x 4 days, Disp: 6 tablet, Rfl: 0 .  clopidogrel (PLAVIX) 75 MG tablet, TAKE 1 TABLET BY MOUTH DAILY., Disp: 90 tablet, Rfl: 3 .  finasteride (PROSCAR) 5 MG tablet, Take 1 tablet by mouth daily., Disp: , Rfl:  .  fluorouracil (EFUDEX) 5 % cream, APPLY ON THE SKIN TWICE A  DAY ,PATIENT IS AWARE TO USE FOR 2 WEEKS ON AREAS TALKED ABOUT, Disp: , Rfl: 0 .  lisinopril (PRINIVIL,ZESTRIL) 2.5 MG tablet, TAKE 1 TABLET (2.5 MG TOTAL) BY MOUTH DAILY., Disp: 90 tablet, Rfl: 3 .  Multiple Vitamin (MULTIVITAMIN) tablet, Take 1 tablet by mouth daily., Disp: , Rfl:  .  nitroGLYCERIN (NITROSTAT) 0.4 MG SL tablet, Place 1 tablet (0.4 mg  total) under the tongue  every 5 (five) minutes x 3  doses as needed for chest  pain., Disp: 100 tablet, Rfl: 0 .  saw palmetto 80 MG capsule,  Take 80 mg by mouth daily., Disp: , Rfl:   EXAM:  VITALS per patient if applicable:  GENERAL: alert, oriented, appears well and in no acute distress  HEENT: atraumatic, conjunttiva clear, no obvious abnormalities on inspection of external nose and ears  NECK: normal movements of the head and neck  LUNGS: on inspection no signs of respiratory distress, breathing rate appears normal, no obvious gross SOB, gasping or wheezing  CV: no obvious cyanosis  MS: moves all visible extremities without noticeable abnormality  PSYCH/NEURO: pleasant and cooperative, no obvious depression or anxiety, speech and thought processing grossly intact  SKIN: Very mild erythema noticed to left shoulder, along clavicle.  No Erythema migrans noted  ASSESSMENT AND PLAN:  Discussed the following assessment and plan:  Tick bite of left side of chest wall, initial encounter  Viewed guidelines with the patient from the infectious disease Society of [place].  Tick was attached less than 36 hours.  No need to treat prophylactically at this time.  Advise close monitoring and red flags reviewed with patient.  If he develops any red flags he is to follow-up soon as possible   I discussed the assessment and treatment plan with the patient. The patient was provided an opportunity to ask questions and all were answered. The patient agreed with the plan and demonstrated an understanding of the instructions.   The patient was advised to call back or seek an in-person evaluation if the symptoms worsen or if the condition fails to improve as anticipated.   Dorothyann Peng, NP

## 2018-10-21 ENCOUNTER — Telehealth: Payer: Medicare Other | Admitting: Cardiovascular Disease

## 2018-10-23 ENCOUNTER — Other Ambulatory Visit: Payer: Self-pay | Admitting: Cardiovascular Disease

## 2018-10-25 NOTE — Telephone Encounter (Signed)
Lisinopril 2.5 mg refilled.

## 2018-10-29 ENCOUNTER — Telehealth: Payer: Self-pay | Admitting: Cardiovascular Disease

## 2018-10-29 NOTE — Telephone Encounter (Signed)
Left message with wife for pre reg/ will call back.

## 2018-11-02 NOTE — Telephone Encounter (Signed)
smartphone/ my chart via emailed/ consent/ pre reg completed

## 2018-11-03 ENCOUNTER — Telehealth (INDEPENDENT_AMBULATORY_CARE_PROVIDER_SITE_OTHER): Payer: Medicare Other | Admitting: Cardiovascular Disease

## 2018-11-03 ENCOUNTER — Telehealth: Payer: Self-pay

## 2018-11-03 DIAGNOSIS — I2510 Atherosclerotic heart disease of native coronary artery without angina pectoris: Secondary | ICD-10-CM

## 2018-11-03 DIAGNOSIS — E782 Mixed hyperlipidemia: Secondary | ICD-10-CM

## 2018-11-03 DIAGNOSIS — I10 Essential (primary) hypertension: Secondary | ICD-10-CM

## 2018-11-03 NOTE — Progress Notes (Signed)
Virtual Visit via Video Note   This visit type was conducted due to national recommendations for restrictions regarding the COVID-19 Pandemic (e.g. social distancing) in an effort to limit this patient's exposure and mitigate transmission in our community.  Due to his co-morbid illnesses, this patient is at least at moderate risk for complications without adequate follow up.  This format is felt to be most appropriate for this patient at this time.  All issues noted in this document were discussed and addressed.  A limited physical exam was performed with this format.  Please refer to the patient's chart for his consent to telehealth for [facility].   Date:  11/03/2018   ID:  Jaelen Soth, DOB [birth year], MRN [id number]  Patient Location: Home Provider Location: Home  PCP:  Dorothyann Peng, NP  Cardiologist:  Quay Burow, MD  Electrophysiologist:  None   Evaluation Performed:  Follow-Up Visit  Chief Complaint: Follow-up CAD, annual office visit  History of Present Illness:    Blake Evans is a 68 y.o.  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 I last saw him in the office  10/13/2017.His cardiovascular risk factor profile is positive for hyperlipidemia and a family history of a father that had bypass surgery. 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 I brought him to the cath lab at 2:00 in the morning and it demonstrated an occluded dominant distal RCA which I stented. He had circumflex and LAD diagonal branch disease as well. I brought him back 3 days later and in a staged fashion stented his distal LAD diagonal branch as well as his mid AV groove circumflex and he was discharged the following day. He feels well and has had no recurrent symptoms.  He exercises frequently without any limitation and actually rode his  bicycle to his appointment this morning.  He has modified his diet and participated in cardiac rehab.Recent lipid profile performed 10/29/2017 revealed a total cholesterol of 141, LDL of 62 and HDL of 65.  Since I saw him a year ago he is remained stable.  He still exercises several days a week walking 3 to 4 miles twice a week and golfing 1-2 times once a week as well.  He denies chest pain or shortness of breath.  He is sheltering in place and socially distancing and continue to work on his nonprofit work. The patient does not have symptoms concerning for COVID-19 infection (fever, chills, cough, or new shortness of breath).    Past Medical History:  Diagnosis Date  . Adenomatous colon polyp 2006  . Allergy    SEASONAL  . BPH (benign prostatic hyperplasia) 11/21/2011  . CAD (coronary artery disease), Residual disease of LAD and LCX 11/21/2011   myoview 01/09/12-positive bruce treadmill test with ventricular bigemeny, 2-3 mm horizontal ST segment depressions in II,III, AVF at peak exercise  . Hyperlipidemia 11/25/2011  . Kidney stone   . S/P angioplasty with stent, to LAD/Diag and AV groove, LCX.  11/24/11 11/25/2011  . STEMI (ST elevation myocardial infarction), ST elevation in inf. wall 11/21/2011   Past Surgical History:  Procedure Laterality Date  . CORONARY ANGIOPLASTY WITH STENT PLACEMENT  11/21/11   inferior wall MI; PCI and stenting to distal RCA, Resolute DES 3x34mm  . CORONARY ANGIOPLASTY WITH STENT PLACEMENT  11/24/11   distal LAD diag branch PCI and stenting with a  resolute DES along with mid AV groove circ PCI with a resolute DES   . LEFT HEART CATHETERIZATION WITH CORONARY ANGIOGRAM N/A 11/21/2011   Procedure: LEFT HEART CATHETERIZATION WITH CORONARY ANGIOGRAM;  Surgeon: Lorretta Harp, MD;  Location: [facility] CATH LAB;  Service: Cardiovascular;  Laterality: N/A;  . NO PAST SURGERIES    . PERCUTANEOUS CORONARY STENT INTERVENTION (PCI-S)  11/21/2011   Procedure: PERCUTANEOUS CORONARY STENT  INTERVENTION (PCI-S);  Surgeon: Lorretta Harp, MD;  Location: [facility] CATH LAB;  Service: Cardiovascular;;  . PERCUTANEOUS CORONARY STENT INTERVENTION (PCI-S) N/A 11/24/2011   Procedure: PERCUTANEOUS CORONARY STENT INTERVENTION (PCI-S);  Surgeon: Lorretta Harp, MD;  Location: [facility] CATH LAB;  Service: Cardiovascular;  Laterality: N/A;     No outpatient medications have been marked as taking for the 11/03/18 encounter (Appointment) with Lorretta Harp, MD.     Allergies:   Patient has no known allergies.   Social History   Tobacco Use  . Smoking status: Never Smoker  . Smokeless tobacco: Never Used  Substance Use Topics  . Alcohol use: Yes    Alcohol/week: 0.0 standard drinks    Comment: occasional  . Drug use: No     Family Hx: The patient's family history includes Coronary artery disease in his father; Diabetes in his mother and sister; Heart disease in his father; Hyperlipidemia in his father.  ROS:   Please see the history of present illness.     All other systems reviewed and are negative.   Prior CV studies:   The following studies were reviewed today:  None  Labs/Other Tests and Data Reviewed:    EKG:  No ECG reviewed.  Recent Labs: No results found for requested labs within last 8760 hours.   Recent Lipid Panel Lab Results  Component Value Date/Time   CHOL 141 10/29/2017 08:59 AM   TRIG 72 10/29/2017 08:59 AM   HDL 65 10/29/2017 08:59 AM   CHOLHDL 2.2 10/29/2017 08:59 AM   CHOLHDL 2 10/07/2016 10:48 AM   LDLCALC 62 10/29/2017 08:59 AM    Wt Readings from Last 3 Encounters:  03/08/18 160 lb (72.6 kg)  02/22/18 158 lb (71.7 kg)  10/13/17 159 lb 12.8 oz (72.5 kg)     Objective:    Vital Signs:  There were no vitals taken for this visit.   VITAL SIGNS:  reviewed GEN:  no acute distress RESPIRATORY:  normal respiratory effort, symmetric expansion NEURO:  alert and oriented x 3, no obvious focal deficit PSYCH:  normal affect  ASSESSMENT & PLAN:     1. Coronary artery disease- history of CAD status post inferior myocardial infarction 11/21/2011 treated with RCA intervention using a drug-eluting stent.  He did have residual disease and underwent staged intervention of his LAD, diagonal branch and circumflex 3 days later.  He remains on dual antiplatelet therapy with aspirin and Plavix and is completely asymptomatic. 2. Essential hypertension- history of essential hypertension with blood pressure measured by the patient today at home of 121/68 with a pulse of 52.  He is on lisinopril 3. Hyperlipidemia- history of hyperlipidemia on atorvastatin with lipid profile performed 10/29/2017 revealing total cholesterol 141, LDL of 62 and HDL of 65.  COVID-19 Education: The signs and symptoms of COVID-19 were discussed with the patient and how to seek care for testing (follow up with PCP or arrange E-visit).  The importance of social distancing was discussed today.  Time:   Today, I have spent 6 minutes with the patient with  telehealth technology discussing the above problems.     Medication Adjustments/Labs and Tests Ordered: Current medicines are reviewed at length with the patient today.  Concerns regarding medicines are outlined above.   Tests Ordered: No orders of the defined types were placed in this encounter.   Medication Changes: No orders of the defined types were placed in this encounter.   Disposition:  Follow up in 1 year(s)  Signed, Quay Burow, MD  11/03/2018 7:55 AM    [facility]

## 2018-11-03 NOTE — Telephone Encounter (Signed)
Patient and/or DPR-approved person aware of AVS instructions and verbalized understanding. After Visit Summary to be released to [facility]

## 2018-11-03 NOTE — Patient Instructions (Signed)
Medication Instructions:  Your physician recommends that you continue on your current medications as directed. Please refer to the Current Medication list given to you today.  If you need a refill on your cardiac medications before your next appointment, please call your pharmacy.   Lab work: NONE If you have labs (blood work) drawn today and your tests are completely normal, you will receive your results only by: Marland Kitchen MyChart Message (if you have MyChart) OR . A paper copy in the mail If you have any lab test that is abnormal or we need to change your treatment, we will call you to review the results.  Testing/Procedures: NONE  Follow-Up: At [facility], you and your health needs are our priority.  As part of our continuing mission to provide you 2with exceptional heart care, we have created designated Provider Care Teams.  These Care Teams include your primary Cardiologist (physician) and Advanced Practice Providers (APPs -  Physician Assistants and Nurse Practitioners) who all work together to provide you with the care you need, when you need it. You will need a follow up appointment in 12 months with Dr. Gwenlyn Found.  Please call our office 2 months in advance to schedule this appointment.

## 2018-11-27 ENCOUNTER — Other Ambulatory Visit: Payer: Self-pay | Admitting: Cardiovascular Disease

## 2018-12-30 ENCOUNTER — Other Ambulatory Visit: Payer: Self-pay | Admitting: Cardiovascular Disease

## 2019-07-08 ENCOUNTER — Ambulatory Visit: Payer: Medicare Other

## 2019-07-16 ENCOUNTER — Ambulatory Visit: Payer: Medicare Other | Attending: Internal Medicine

## 2019-07-16 DIAGNOSIS — Z23 Encounter for immunization: Secondary | ICD-10-CM

## 2019-07-16 NOTE — Progress Notes (Signed)
   Covid-19 Vaccination Clinic  Name:  Blake Evans    MRN: [id number] DOB: 21-Sep-1950  07/16/2019  Mr. Blake Evans was observed post Covid-19 immunization for 15 minutes without incidence. He was provided with Vaccine Information Sheet and instruction to access the V-Safe system.   Mr. Blake Evans was instructed to call 911 with any severe reactions post vaccine: Marland Kitchen Difficulty breathing  . Swelling of your face and throat  . A fast heartbeat  . A bad rash all over your body  . Dizziness and weakness    Immunizations Administered    Name Date Dose VIS Date Route   Pfizer COVID-19 Vaccine 07/16/2019  4:26 PM 0.3 mL 05/20/2019 Intramuscular   Manufacturer: [place]   Lot: [id number]   [place]: [id number]

## 2019-07-29 ENCOUNTER — Ambulatory Visit: Payer: Medicare Other

## 2019-08-10 ENCOUNTER — Ambulatory Visit: Payer: Medicare Other | Attending: Internal Medicine

## 2019-08-10 DIAGNOSIS — Z23 Encounter for immunization: Secondary | ICD-10-CM

## 2019-08-10 NOTE — Progress Notes (Signed)
   Covid-19 Vaccination Clinic  Name:  Mato Klay    MRN: [id number] DOB: 1951-05-19  08/10/2019  Mr. Vanotterloo was observed post Covid-19 immunization for 15 minutes without incident. He was provided with Vaccine Information Sheet and instruction to access the V-Safe system.   Mr. Forestier was instructed to call 911 with any severe reactions post vaccine: Marland Kitchen Difficulty breathing  . Swelling of face and throat  . A fast heartbeat  . A bad rash all over body  . Dizziness and weakness   Immunizations Administered    Name Date Dose VIS Date Route   Pfizer COVID-19 Vaccine 08/10/2019 12:40 PM 0.3 mL 05/20/2019 Intramuscular   Manufacturer: [place]   Lot: [id number]   [place]: [id number]

## 2019-10-06 IMAGING — DX DG FOOT COMPLETE 3+V*R*
3 series · 3 of 3 positions shown · non-contrast
Comparison: None.

CLINICAL DATA: Injury 2 weeks ago.  Right foot pain

EXAM:
RIGHT FOOT COMPLETE - 3+ VIEW

[foot ap]
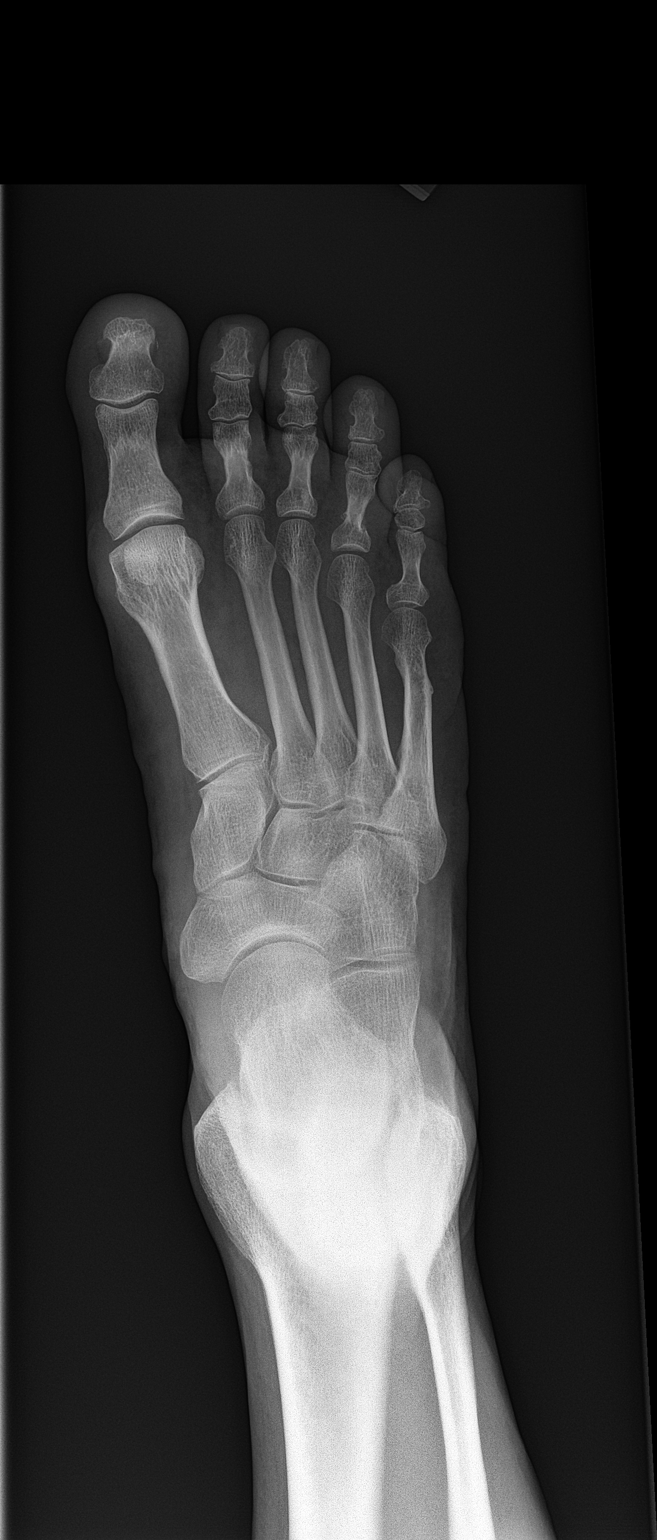

[foot obl]
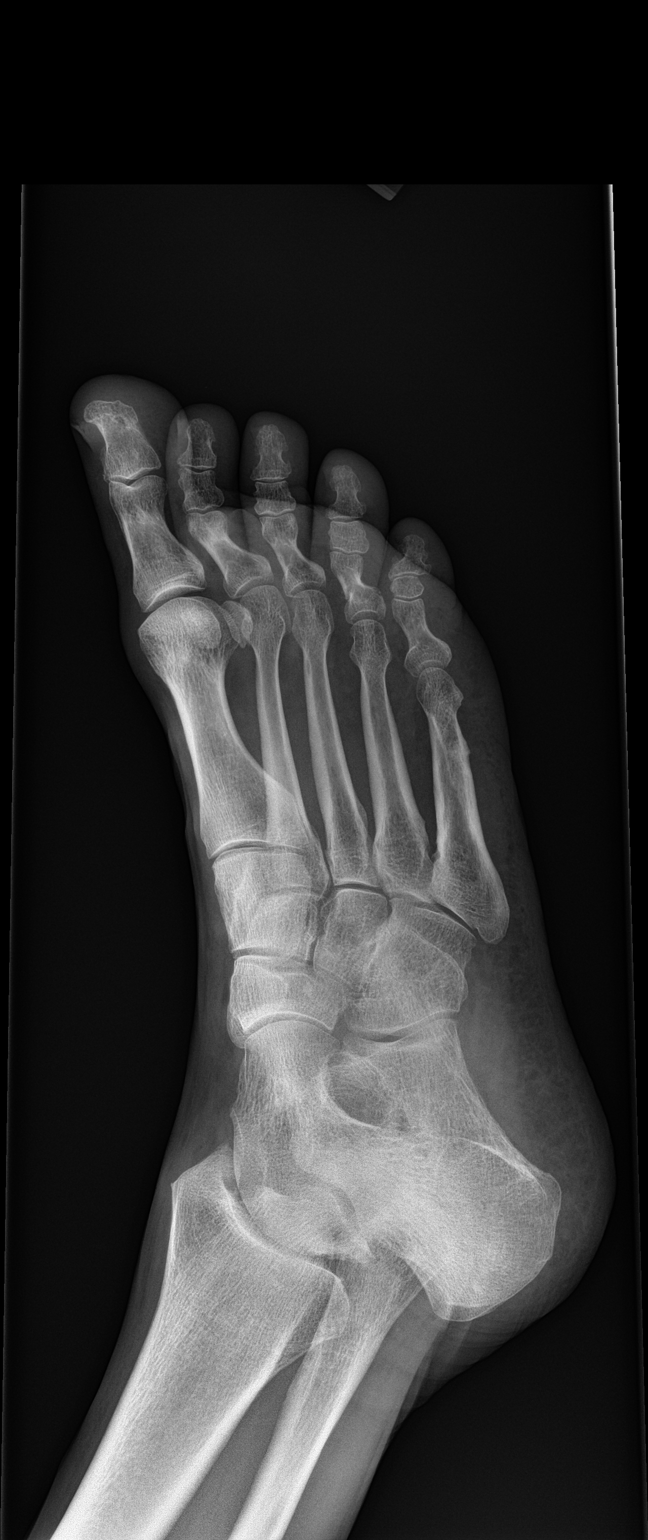

[foot lat]
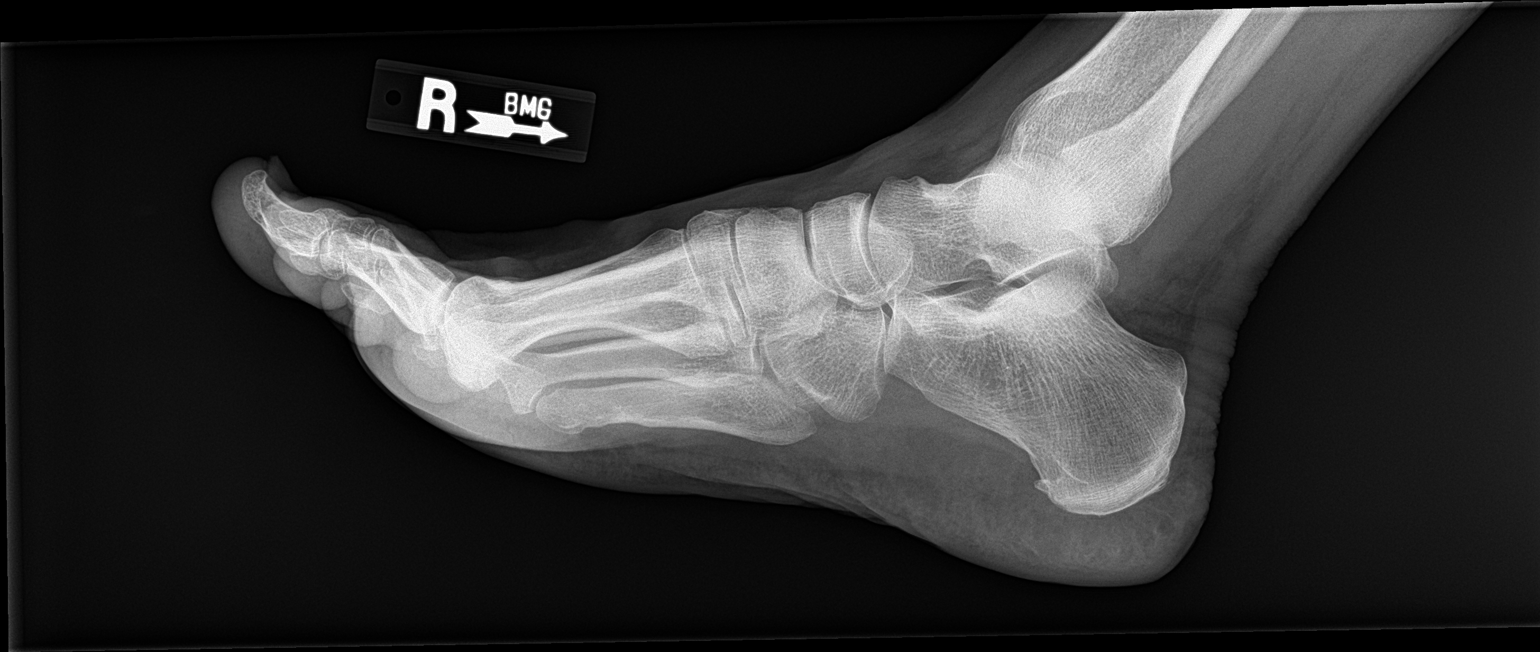

[3 of 3 positions shown; findings below may reference images not displayed]

FINDINGS: Probable old healed 5th metatarsal fracture. Probable acute fracture
noted in the proximal phalanx of the right 4th toe, not
significantly displaced. No subluxation or dislocation. Soft tissues
are intact.
IMPRESSION: Acute right 4th toe proximal phalangeal fracture. Not significantly
displaced.

## 2019-10-18 ENCOUNTER — Other Ambulatory Visit: Payer: Self-pay | Admitting: Cardiovascular Disease

## 2019-11-01 ENCOUNTER — Other Ambulatory Visit: Payer: Self-pay | Admitting: Cardiovascular Disease

## 2019-11-14 LAB — PSA: PSA: 5.57

## 2019-11-22 ENCOUNTER — Other Ambulatory Visit: Payer: Self-pay | Admitting: Cardiovascular Disease

## 2019-11-25 ENCOUNTER — Encounter: Payer: Self-pay | Admitting: Adult Health

## 2020-01-27 ENCOUNTER — Other Ambulatory Visit: Payer: Self-pay | Admitting: Cardiovascular Disease

## 2020-02-21 ENCOUNTER — Other Ambulatory Visit: Payer: Self-pay | Admitting: Cardiovascular Disease

## 2020-02-23 ENCOUNTER — Other Ambulatory Visit: Payer: Self-pay | Admitting: Cardiovascular Disease

## 2020-03-07 ENCOUNTER — Other Ambulatory Visit: Payer: Self-pay

## 2020-03-07 ENCOUNTER — Ambulatory Visit: Payer: Medicare Other | Admitting: Cardiovascular Disease

## 2020-03-07 ENCOUNTER — Encounter: Payer: Self-pay | Admitting: Cardiovascular Disease

## 2020-03-07 DIAGNOSIS — E782 Mixed hyperlipidemia: Secondary | ICD-10-CM | POA: Diagnosis not present

## 2020-03-07 DIAGNOSIS — I10 Essential (primary) hypertension: Secondary | ICD-10-CM | POA: Diagnosis not present

## 2020-03-07 DIAGNOSIS — I2111 ST elevation (STEMI) myocardial infarction involving right coronary artery: Secondary | ICD-10-CM

## 2020-03-07 LAB — LIPID PANEL
Chol/HDL Ratio: 2 ratio (ref 0.0–5.0)
Cholesterol, Total: 149 mg/dL (ref 100–199)
HDL: 75 mg/dL (ref >39)
LDL Chol Calc (NIH): 58 mg/dL (ref 0–99)
Triglycerides: 89 mg/dL (ref 0–149)
VLDL Cholesterol Cal: 16 mg/dL (ref 5–40)

## 2020-03-07 LAB — HEPATIC FUNCTION PANEL
ALT: 29 IU/L (ref 0–44)
AST: 26 IU/L (ref 0–40)
Albumin: 4.6 g/dL (ref 3.8–4.8)
Alkaline Phosphatase: 60 IU/L (ref 44–121)
Bilirubin Total: 2.3 mg/dL — ABNORMAL HIGH (ref 0.0–1.2)
Bilirubin, Direct: 0.25 mg/dL (ref 0.00–0.40)
Total Protein: 6.9 g/dL (ref 6.0–8.5)

## 2020-03-07 MED ORDER — CLOPIDOGREL BISULFATE 75 MG PO TABS: ORAL_TABLET | ORAL | 3 refills | Status: DC

## 2020-03-07 NOTE — Progress Notes (Signed)
03/07/2020 Blake Evans   02-Aug-1950  [id number]  Primary Physician Nafziger, Tommi Rumps, NP Primary Cardiologist: Lorretta Harp MD [name], [place]  HPI:  Blake Evans is a 69 y.o.   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 I last saw him  for a virtual telemedicine video visit 11/03/2018.  His cardiovascular risk factor profile is positive for hyperlipidemia and a family history of a father that had bypass surgery. 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 I brought him to the cath lab at 2:00 in the morning and it demonstrated an occluded dominant distal RCA which I stented. He had circumflex and LAD diagonal branch disease as well. I brought him back 3 days later and in a staged fashion stented his distal LAD diagonal branch as well as his mid AV groove circumflex and he was discharged the following day. He feels well and has had no recurrent symptoms.  He exercises frequently without any limitation and actually rode his bicycle to his appointment this morning.  He has modified his diet and participated in cardiac rehab.  Since I saw him a year ago he continues to do well.  He does exercise frequently during the week.  He is totally asymptomatic.    Current Meds  Medication Sig  . aspirin 81 MG tablet Take 81 mg by mouth 2 (two) times daily.   Marland Kitchen atorvastatin (LIPITOR) 40 MG tablet Take 1 tablet (40 mg total) by mouth daily.  . clopidogrel (PLAVIX) 75 MG tablet TAKE 1 TABLET (75 MG TOTAL) BY MOUTH DAILY. NEED OFFICE VISIT.  . finasteride (PROSCAR) 5 MG tablet Take 1 tablet by mouth daily.  Marland Kitchen lisinopril (ZESTRIL) 2.5 MG tablet TAKE 1 TABLET BY MOUTH EVERY DAY  . Multiple Vitamin (MULTIVITAMIN) tablet Take 1 tablet by mouth daily.  . nitroGLYCERIN (NITROSTAT) 0.4 MG SL tablet Place 1 tablet (0.4 mg  total) under  the tongue  every 5 (five) minutes x 3  doses as needed for chest  pain.  . saw palmetto 80 MG capsule Take 80 mg by mouth daily.  . [DISCONTINUED] clopidogrel (PLAVIX) 75 MG tablet TAKE 1 TABLET (75 MG TOTAL) BY MOUTH DAILY. NEED OFFICE VISIT.     No Known Allergies  Social History   Socioeconomic History  . Marital status: Married    Spouse name: Not on file  . Number of children: Not on file  . Years of education: Not on file  . Highest education level: Not on file  Occupational History  . Not on file  Tobacco Use  . Smoking status: Never Smoker  . Smokeless tobacco: Never Used  Substance and Sexual Activity  . Alcohol use: Yes    Alcohol/week: 0.0 standard drinks    Comment: occasional  . Drug use: No  . Sexual activity: Yes    Birth control/protection: None  Other Topics Concern  . 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 Difficulty of Paying Living Expenses: Not on file  Food Insecurity:   . Worried About Charity fundraiser in the Last Year: Not on file  .  Ran Out of Food in the Last Year: Not on file  Transportation Needs:   . Lack of Transportation (Medical): Not on file  . Lack of Transportation (Non-Medical): Not on file  Physical Activity:   . Days of Exercise per Week: Not on file  . Minutes of Exercise per Session: Not on file  Stress:   . Feeling of Stress : Not on file  Social Connections:   . Frequency of Communication with Friends and Family: Not on file  . Frequency of Social Gatherings with Friends and Family: Not on file  . Attends Religious Services: Not on file  . Active Member of Clubs or Organizations: Not on file  . Attends Archivist Meetings: Not on file  . Marital Status: Not on file  Intimate Partner Violence:   . Fear of Current or Ex-Partner:  Not on file  . Emotionally Abused: Not on file  . Physically Abused: Not on file  . Sexually Abused: Not on file     Review of Systems: General: negative for chills, fever, night sweats or weight changes.  Cardiovascular: negative for chest pain, dyspnea on exertion, edema, orthopnea, palpitations, paroxysmal nocturnal dyspnea or shortness of breath Dermatological: negative for rash Respiratory: negative for cough or wheezing Urologic: negative for hematuria Abdominal: negative for nausea, vomiting, diarrhea, bright red blood per rectum, melena, or hematemesis Neurologic: negative for visual changes, syncope, or dizziness All other systems reviewed and are otherwise negative except as noted above.    Blood pressure 120/62, pulse 61, height 5\' 11"  (1.803 m), weight 157 lb (71.2 kg), SpO2 97 %.  General appearance: alert and no distress Neck: no adenopathy, no carotid bruit, no JVD, supple, symmetrical, trachea midline and thyroid not enlarged, symmetric, no tenderness/mass/nodules Lungs: clear to auscultation bilaterally Heart: regular rate and rhythm, S1, S2 normal, no murmur, click, rub or gallop Extremities: extremities normal, atraumatic, no cyanosis or edema Pulses: 2+ and symmetric Skin: Skin color, texture, turgor normal. No rashes or lesions Neurologic: Alert and oriented X 3, normal strength and tone. Normal symmetric reflexes. Normal coordination and gait  EKG sinus rhythm at 61 with septal Q waves.  I personally reviewed this EKG.  We will get that we can  ASSESSMENT AND PLAN:   Hyperlipidemia History of hyperlipidemia on statin therapy.  We will recheck a lipid liver profile this morning.  STEMI (ST elevation myocardial infarction), ST elevation in inf. wall, s/p DES resolute to RCA 11/21/11 History of CAD status post inferior STEMI 11/11/2011.  I brought him to the Cath Lab to clip the morning demonstrating occluded dominant RCA which I stented.  He had circumflex and LAD  the diagonal branch disease which I intervened on in a staged fashion 3 days later.  He has had no recurrent symptoms.  Essential hypertension  History of essential hypertension blood pressure measured today 120/62.  He is on lisinopril.      Lorretta Harp MD FACP,FACC,FAHA, [facility] 03/07/2020 8:45 AM

## 2020-03-07 NOTE — Patient Instructions (Addendum)
Medication Instructions:  The current medical regimen is effective;  continue present plan and medications.  *If you need a refill on your cardiac medications before your next appointment, please call your pharmacy*   Lab Work: LIPID/LIVER   If you have labs (blood work) drawn today and your tests are completely normal, you will receive your results only by: Marland Kitchen MyChart Message (if you have MyChart) OR . A paper copy in the mail If you have any lab test that is abnormal or we need to change your treatment, we will call you to review the results.   Follow-Up: At [facility], you and your health needs are our priority.  As part of our continuing mission to provide you with exceptional heart care, we have created designated Provider Care Teams.  These Care Teams include your primary Cardiologist (physician) and Advanced Practice Providers (APPs -  Physician Assistants and Nurse Practitioners) who all work together to provide you with the care you need, when you need it.  We recommend signing up for the patient portal called "MyChart".  Sign up information is provided on this After Visit Summary.  MyChart is used to connect with patients for Virtual Visits (Telemedicine).  Patients are able to view lab/test results, encounter notes, upcoming appointments, etc.  Non-urgent messages can be sent to your provider as well.   To learn more about what you can do with MyChart, go to [email or web address].    Your next appointment:   12 month(s)  The format for your next appointment:   In Person  Provider:   Quay Burow, MD    Dr. The patient is a patient seen the patient

## 2020-03-07 NOTE — Assessment & Plan Note (Signed)
History of CAD status post inferior STEMI 11/11/2011.  I brought him to the Cath Lab to clip the morning demonstrating occluded dominant RCA which I stented.  He had circumflex and LAD the diagonal branch disease which I intervened on in a staged fashion 3 days later.  He has had no recurrent symptoms.

## 2020-03-07 NOTE — Assessment & Plan Note (Signed)
History of essential hypertension blood pressure measured today 120/62.  He is on lisinopril.

## 2020-03-07 NOTE — Assessment & Plan Note (Signed)
History of hyperlipidemia on statin therapy.  We will recheck a lipid liver profile this morning. 

## 2020-03-27 ENCOUNTER — Other Ambulatory Visit: Payer: Self-pay | Admitting: Cardiovascular Disease

## 2020-04-15 ENCOUNTER — Other Ambulatory Visit: Payer: Self-pay | Admitting: Cardiovascular Disease

## 2020-06-12 ENCOUNTER — Other Ambulatory Visit: Payer: Self-pay

## 2020-06-12 MED ORDER — NITROGLYCERIN 0.4 MG SL SUBL: SUBLINGUAL_TABLET | SUBLINGUAL | 2 refills | Status: DC

## 2020-07-13 ENCOUNTER — Telehealth: Payer: Self-pay | Admitting: Adult Health

## 2020-07-13 NOTE — Telephone Encounter (Signed)
Left message for patient to call back and schedule Medicare Annual Wellness Visit (AWV) either virtually or in office.   Last AWV  10/07/16  please schedule at anytime with [facility] Nurse Health Advisor 1 or 2   This should be a 45 minute visit. Patient also needs appointment with pcp last appointment 09/15/2018

## 2020-08-01 NOTE — Progress Notes (Addendum)
Subjective:   Blake Evans is a 70 y.o. male who presents for an Initial Medicare Annual Wellness Visit.  I connected with Shaheen Mende today by telephone and verified that I am speaking with the correct person using two identifiers. Location patient: home Location provider: work Persons participating in the virtual visit: patient, provider.   I discussed the limitations, risks, security and privacy concerns of performing an evaluation and management service by telephone and the availability of in person appointments. I also discussed with the patient that there may be a patient responsible charge related to this service. The patient expressed understanding and verbally consented to this telephonic visit.    Interactive audio and video telecommunications were attempted between this provider and patient, however failed, due to patient having technical difficulties OR patient did not have access to video capability.  We continued and completed visit with audio only.      Review of Systems    N/A  Cardiac Risk Factors include: advanced age (>43men, >6 women);male gender;hypertension     Objective:    Today's Vitals   There is no height or weight on file to calculate BMI.  Advanced Directives 08/03/2020 10/07/2016 11/21/2011  Does Patient Have a Medical Advance Directive? Yes Yes Patient does not have advance directive  Type of Advance Directive [place];Living will - -  Does patient want to make changes to medical advance directive? No - Patient declined - -  Copy of [place] in Chart? No - copy requested - -  Pre-existing out of facility DNR order (yellow form or pink MOST form) - - No    Current Medications (verified) Outpatient Encounter Medications as of 08/03/2020  Medication Sig  . aspirin 81 MG tablet Take 81 mg by mouth 2 (two) times daily.   Marland Kitchen atorvastatin (LIPITOR) 40 MG tablet TAKE 1 TABLET BY MOUTH EVERY DAY  . clopidogrel  (PLAVIX) 75 MG tablet TAKE 1 TABLET (75 MG TOTAL) BY MOUTH DAILY. NEED OFFICE VISIT.  . finasteride (PROSCAR) 5 MG tablet Take 1 tablet by mouth daily.  Marland Kitchen lisinopril (ZESTRIL) 2.5 MG tablet TAKE 1 TABLET BY MOUTH EVERY DAY  . Multiple Vitamin (MULTIVITAMIN) tablet Take 1 tablet by mouth daily.  . saw palmetto 80 MG capsule Take 80 mg by mouth daily.  . nitroGLYCERIN (NITROSTAT) 0.4 MG SL tablet Place 1 tablet (0.4 mg  total) under the tongue  every 5 (five) minutes x 3  doses as needed for chest  pain. (Patient not taking: Reported on 08/03/2020)   No facility-administered encounter medications on file as of 08/03/2020.    Allergies (verified) Patient has no known allergies.   History: Past Medical History:  Diagnosis Date  . Adenomatous colon polyp 2006  . Allergy    SEASONAL  . BPH (benign prostatic hyperplasia) 11/21/2011  . CAD (coronary artery disease), Residual disease of LAD and LCX 11/21/2011   myoview 01/09/12-positive bruce treadmill test with ventricular bigemeny, 2-3 mm horizontal ST segment depressions in II,III, AVF at peak exercise  . Cataract   . Hyperlipidemia 11/25/2011  . Kidney stone   . S/P angioplasty with stent, to LAD/Diag and AV groove, LCX.  11/24/11 11/25/2011  . STEMI (ST elevation myocardial infarction), ST elevation in inf. wall 11/21/2011   Past Surgical History:  Procedure Laterality Date  . CATARACT EXTRACTION Bilateral   . CORONARY ANGIOPLASTY WITH STENT PLACEMENT  11/21/11   inferior wall MI; PCI and stenting to distal RCA, Resolute  DES 3x65mm  . CORONARY ANGIOPLASTY WITH STENT PLACEMENT  11/24/11   distal LAD diag branch PCI and stenting with a resolute DES along with mid AV groove circ PCI with a resolute DES   . LEFT HEART CATHETERIZATION WITH CORONARY ANGIOGRAM N/A 11/21/2011   Procedure: LEFT HEART CATHETERIZATION WITH CORONARY ANGIOGRAM;  Surgeon: Lorretta Harp, MD;  Location: [facility] CATH LAB;  Service: Cardiovascular;  Laterality: N/A;  . NO PAST  SURGERIES    . PERCUTANEOUS CORONARY STENT INTERVENTION (PCI-S)  11/21/2011   Procedure: PERCUTANEOUS CORONARY STENT INTERVENTION (PCI-S);  Surgeon: Lorretta Harp, MD;  Location: [facility] CATH LAB;  Service: Cardiovascular;;  . PERCUTANEOUS CORONARY STENT INTERVENTION (PCI-S) N/A 11/24/2011   Procedure: PERCUTANEOUS CORONARY STENT INTERVENTION (PCI-S);  Surgeon: Lorretta Harp, MD;  Location: [facility] CATH LAB;  Service: Cardiovascular;  Laterality: N/A;   Family History  Problem Relation Age of Onset  . Coronary artery disease Father   . Heart disease Father   . Hyperlipidemia Father   . Diabetes Mother   . Diabetes Sister    Social History   Socioeconomic History  . Marital status: Married    Spouse name: Not on file  . Number of children: Not on file  . Years of education: Not on file  . Highest education level: Not on file  Occupational History  . Not on file  Tobacco Use  . Smoking status: Never Smoker  . Smokeless tobacco: Never Used  Substance and Sexual Activity  . Alcohol use: Yes    Alcohol/week: 0.0 standard drinks    Comment: occasional  . Drug use: No  . Sexual activity: Yes    Birth control/protection: None  Other Topics Concern  . 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 Difficulty of Paying Living Expenses: Not hard at all  Food Insecurity: No Food Insecurity  . Worried About Charity fundraiser in the Last Year: Never true  . Ran Out of Food in the Last Year: Never true  Transportation Needs: No Transportation Needs  . Lack of Transportation (Medical): No  . Lack of Transportation (Non-Medical): No  Physical Activity: Sufficiently Active  . Days of Exercise per Week: 7 days  . Minutes of Exercise per Session: 40 min  Stress: No Stress Concern Present  . Feeling of Stress  : Only a little  Social Connections: Socially Integrated  . Frequency of Communication with Friends and Family: Twice a week  . Frequency of Social Gatherings with Friends and Family: Twice a week  . Attends Religious Services: 1 to 4 times per year  . Active Member of Clubs or Organizations: Yes  . Attends Archivist Meetings: More than 4 times per year  . Marital Status: Married    Tobacco Counseling Counseling given: Not Answered   Clinical Intake:  Pre-visit preparation completed: Yes  Pain : No/denies pain     Nutritional Risks: None Diabetes: No  How often do you need to have someone help you when you read instructions, pamphlets, or other written materials from your doctor or pharmacy?: 1 - Never What is the last grade level you completed in school?: College  Diabetic?No   Interpreter Needed?: No  Information entered by :: SCrews,LPN  Activities of Daily Living In your present state of health, do you have any difficulty performing the following activities: 08/03/2020  Hearing? Y  Comment has bilateral hearing aids that he wears sometimes  Vision? N  Difficulty concentrating or making decisions? Y  Comment has difficulty remembering short term things  Walking or climbing stairs? N  Dressing or bathing? N  Doing errands, shopping? N  Preparing Food and eating ? N  Using the Toilet? N  In the past six months, have you accidently leaked urine? Y  Do you have problems with loss of bowel control? N  Managing your Medications? N  Managing your Finances? N  Housekeeping or managing your Housekeeping? N  Some recent data might be hidden    Patient Care Team: Dorothyann Peng, NP as PCP - General (Family Medicine) Lorretta Harp, MD as PCP - Cardiology (Cardiology)  Indicate any recent Medical Services you may have received from other than [facility] providers in the past year (date may be approximate).     Assessment:   This is a routine wellness  examination for Blake Evans.  Hearing/Vision screen  Hearing Screening   125Hz  250Hz  500Hz  1000Hz  2000Hz  3000Hz  4000Hz  6000Hz  8000Hz   Right ear:           Left ear:           Vision Screening Comments: Gets eye exam twice a year. Had bilateral cataract surgery. Wears reading glasses only    Dietary issues and exercise activities discussed: Current Exercise Habits: Home exercise routine, Type of exercise: walking, Time (Minutes): 45, Frequency (Times/Week): 7, Weekly Exercise (Minutes/Week): 315, Intensity: Moderate, Exercise limited by: None identified  Goals    . patient     Watch sodium;       Depression Screen PHQ 2/9 Scores 08/03/2020 02/22/2018 07/10/2017 10/07/2016 10/04/2015 10/01/2015 08/03/2015  PHQ - 2 Score 0 0 0 0 0 0 0    Fall Risk Fall Risk  08/03/2020 02/22/2018 07/10/2017 10/07/2016 10/04/2015  Falls in the past year? 0 No No No Yes  Number falls in past yr: 0 - - - 1  Injury with Fall? 0 - - - No  Risk for fall due to : No Fall Risks - - - -  Follow up Falls evaluation completed;Falls prevention discussed - - - -    FALL RISK PREVENTION PERTAINING TO THE HOME:  Any stairs in or around the home? Yes  If so, are there any without handrails? No  Home free of loose throw rugs in walkways, pet beds, electrical cords, etc? Yes  Adequate lighting in your home to reduce risk of falls? Yes   ASSISTIVE DEVICES UTILIZED TO PREVENT FALLS:  Life alert? No  Use of a cane, walker or w/c? No  Grab bars in the bathroom? No  Shower chair or bench in shower? No  Elevated toilet seat or a handicapped toilet? No   Cognitive Function: Normal cognitive status assessed by direct observation by this Nurse Health Advisor. No abnormalities found.   MMSE - Mini Mental State Exam 10/07/2016  Not completed: (No Data)        Immunizations Immunization History  Administered Date(s) Administered  . Influenza Split 03/09/2012  . Influenza, High Dose Seasonal PF 03/15/2019  . Influenza-Unspecified  04/09/2014, 03/09/2017, 03/09/2018, 03/23/2020  . PFIZER(Purple Top)SARS-COV-2 Vaccination 07/16/2019, 08/10/2019  . Pneumococcal Conjugate-13 10/07/2016  . Td 06/10/1999  . Tdap 03/17/2012    TDAP status: Up to date  Flu Vaccine status: Up to date  Pneumococcal vaccine status: Due, Education has been provided regarding the importance of this vaccine. Advised may receive this vaccine at local pharmacy or Health Dept. Aware to provide a copy of the vaccination record if obtained from local pharmacy or Health Dept. Verbalized acceptance and understanding.  Covid-19 vaccine status: Completed vaccines  Qualifies for Shingles Vaccine? Yes   Zostavax completed No   Shingrix Completed?: No.    Education has been provided regarding the importance of this vaccine. Patient has been advised to call insurance company to determine out of pocket expense if they have not yet received this vaccine. Advised may also receive vaccine at local pharmacy or Health Dept. Verbalized acceptance and understanding.  Screening Tests Health Maintenance  Topic Date Due  . PNA vac Low Risk Adult (2 of 2 - PPSV23) 10/07/2017  . COVID-19 Vaccine (3 - Booster for Pfizer series) 02/10/2020  . COLONOSCOPY (Pts 45-77yrs Insurance coverage will need to be confirmed)  10/25/2020  . TETANUS/TDAP  03/17/2022  . INFLUENZA VACCINE  Completed  . Hepatitis C Screening  Completed    Health Maintenance  Health Maintenance Due  Topic Date Due  . PNA vac Low Risk Adult (2 of 2 - PPSV23) 10/07/2017  . COVID-19 Vaccine (3 - Booster for Pfizer series) 02/10/2020    Colorectal cancer screening: Type of screening: Colonoscopy. Completed 10/26/2015. Repeat every 5 years  Lung Cancer Screening: (Low Dose CT Chest recommended if Age 24-80 years, 30 pack-year currently smoking OR have quit w/in 15years.) does not qualify.   Lung Cancer Screening Referral: N/A   Additional Screening:  Hepatitis C Screening: does qualify;  Completed 08/03/2015  Vision Screening: Recommended annual ophthalmology exams for early detection of glaucoma and other disorders of the eye. Is the patient up to date with their annual eye exam?  Yes  Who is the provider or what is the name of the office in which the patient attends annual eye exams? Dr.Graham Lyles If pt is not established with a provider, would they like to be referred to a provider to establish care? No .   Dental Screening: Recommended annual dental exams for proper oral hygiene  Community Resource Referral / Chronic Care Management: CRR required this visit?  No   CCM required this visit?  No      Plan:     I have personally reviewed and noted the following in the patient's chart:   . Medical and social history . Use of alcohol, tobacco or illicit drugs  . Current medications and supplements . Functional ability and status . Nutritional status . Physical activity . Advanced directives . List of other physicians . Hospitalizations, surgeries, and ER visits in previous 12 months . Vitals . Screenings to include cognitive, depression, and falls . Referrals and appointments  In addition, I have reviewed and discussed with patient certain preventive protocols, quality metrics, and best practice recommendations. A written personalized care plan for preventive services as well as general preventive health recommendations were provided to patient.     Ofilia Neas, LPN   9/39/0300   Nurse Notes: None

## 2020-08-03 ENCOUNTER — Ambulatory Visit (INDEPENDENT_AMBULATORY_CARE_PROVIDER_SITE_OTHER): Payer: Medicare Other

## 2020-08-03 DIAGNOSIS — Z0000 Encounter for general adult medical examination without abnormal findings: Secondary | ICD-10-CM

## 2020-08-03 NOTE — Patient Instructions (Signed)
Mr. Blake Evans , Thank you for taking time to come for your Medicare Wellness Visit. I appreciate your ongoing commitment to your health goals. Please review the following plan we discussed and let me know if I can assist you in the future.   Screening recommendations/referrals: Colonoscopy: Up to date, next due 10/25/2020 Recommended yearly ophthalmology/optometry visit for glaucoma screening and checkup Recommended yearly dental visit for hygiene and checkup  Vaccinations: Influenza vaccine: Up to date, next due fall 2022  Pneumococcal vaccine: Currently due for Pneumovax 23  Tdap vaccine: Up to date next due 03/17/2022 Shingles vaccine: Currently due for Shingrix, if you would like to receive we recommend that you receive at your local pharmacy as it is less expensive.    Advanced directives: Please bring copies of your advanced medical directives so that we may scan them into your chart.   Conditions/risks identified: None   Next appointment: None   Preventive Care 65 Years and Older, Male Preventive care refers to lifestyle choices and visits with your health care provider that can promote health and wellness. What does preventive care include?  A yearly physical exam. This is also called an annual well check.  Dental exams once or twice a year.  Routine eye exams. Ask your health care provider how often you should have your eyes checked.  Personal lifestyle choices, including:  Daily care of your teeth and gums.  Regular physical activity.  Eating a healthy diet.  Avoiding tobacco and drug use.  Limiting alcohol use.  Practicing safe sex.  Taking low doses of aspirin every day.  Taking vitamin and mineral supplements as recommended by your health care provider. What happens during an annual well check? The services and screenings done by your health care provider during your annual well check will depend on your age, overall health, lifestyle risk factors, and family  history of disease. Counseling  Your health care provider may ask you questions about your:  Alcohol use.  Tobacco use.  Drug use.  Emotional well-being.  Home and relationship well-being.  Sexual activity.  Eating habits.  History of falls.  Memory and ability to understand (cognition).  Work and work Statistician. Screening  You may have the following tests or measurements:  Height, weight, and BMI.  Blood pressure.  Lipid and cholesterol levels. These may be checked every 5 years, or more frequently if you are over 63 years old.  Skin check.  Lung cancer screening. You may have this screening every year starting at age 7 if you have a 30-pack-year history of smoking and currently smoke or have quit within the past 15 years.  Fecal occult blood test (FOBT) of the stool. You may have this test every year starting at age 12.  Flexible sigmoidoscopy or colonoscopy. You may have a sigmoidoscopy every 5 years or a colonoscopy every 10 years starting at age 48.  Prostate cancer screening. Recommendations will vary depending on your family history and other risks.  Hepatitis C blood test.  Hepatitis B blood test.  Sexually transmitted disease (STD) testing.  Diabetes screening. This is done by checking your blood sugar (glucose) after you have not eaten for a while (fasting). You may have this done every 1-3 years.  Abdominal aortic aneurysm (AAA) screening. You may need this if you are a current or former smoker.  Osteoporosis. You may be screened starting at age 27 if you are at high risk. Talk with your health care provider about your test results, treatment options,  and if necessary, the need for more tests. Vaccines  Your health care provider may recommend certain vaccines, such as:  Influenza vaccine. This is recommended every year.  Tetanus, diphtheria, and acellular pertussis (Tdap, Td) vaccine. You may need a Td booster every 10 years.  Zoster vaccine.  You may need this after age 8.  Pneumococcal 13-valent conjugate (PCV13) vaccine. One dose is recommended after age 72.  Pneumococcal polysaccharide (PPSV23) vaccine. One dose is recommended after age 4. Talk to your health care provider about which screenings and vaccines you need and how often you need them. This information is not intended to replace advice given to you by your health care provider. Make sure you discuss any questions you have with your health care provider. Document Released: 06/22/2015 Document Revised: 02/13/2016 Document Reviewed: 03/27/2015 Elsevier Interactive Patient Education  2017 [place] Prevention in the Home Falls can cause injuries. They can happen to people of all ages. There are many things you can do to make your home safe and to help prevent falls. What can I do on the outside of my home?  Regularly fix the edges of walkways and driveways and fix any cracks.  Remove anything that might make you trip as you walk through a door, such as a raised step or threshold.  Trim any bushes or trees on the path to your home.  Use bright outdoor lighting.  Clear any walking paths of anything that might make someone trip, such as rocks or tools.  Regularly check to see if handrails are loose or broken. Make sure that both sides of any steps have handrails.  Any raised decks and porches should have guardrails on the edges.  Have any leaves, snow, or ice cleared regularly.  Use sand or salt on walking paths during winter.  Clean up any spills in your garage right away. This includes oil or grease spills. What can I do in the bathroom?  Use night lights.  Install grab bars by the toilet and in the tub and shower. Do not use towel bars as grab bars.  Use non-skid mats or decals in the tub or shower.  If you need to sit down in the shower, use a plastic, non-slip stool.  Keep the floor dry. Clean up any water that spills on the floor as soon  as it happens.  Remove soap buildup in the tub or shower regularly.  Attach bath mats securely with double-sided non-slip rug tape.  Do not have throw rugs and other things on the floor that can make you trip. What can I do in the bedroom?  Use night lights.  Make sure that you have a light by your bed that is easy to reach.  Do not use any sheets or blankets that are too big for your bed. They should not hang down onto the floor.  Have a firm chair that has side arms. You can use this for support while you get dressed.  Do not have throw rugs and other things on the floor that can make you trip. What can I do in the kitchen?  Clean up any spills right away.  Avoid walking on wet floors.  Keep items that you use a lot in easy-to-reach places.  If you need to reach something above you, use a strong step stool that has a grab bar.  Keep electrical cords out of the way.  Do not use floor polish or wax that makes floors slippery. If  you must use wax, use non-skid floor wax.  Do not have throw rugs and other things on the floor that can make you trip. What can I do with my stairs?  Do not leave any items on the stairs.  Make sure that there are handrails on both sides of the stairs and use them. Fix handrails that are broken or loose. Make sure that handrails are as long as the stairways.  Check any carpeting to make sure that it is firmly attached to the stairs. Fix any carpet that is loose or worn.  Avoid having throw rugs at the top or bottom of the stairs. If you do have throw rugs, attach them to the floor with carpet tape.  Make sure that you have a light switch at the top of the stairs and the bottom of the stairs. If you do not have them, ask someone to add them for you. What else can I do to help prevent falls?  Wear shoes that:  Do not have high heels.  Have rubber bottoms.  Are comfortable and fit you well.  Are closed at the toe. Do not wear sandals.  If  you use a stepladder:  Make sure that it is fully opened. Do not climb a closed stepladder.  Make sure that both sides of the stepladder are locked into place.  Ask someone to hold it for you, if possible.  Clearly mark and make sure that you can see:  Any grab bars or handrails.  First and last steps.  Where the edge of each step is.  Use tools that help you move around (mobility aids) if they are needed. These include:  Canes.  Walkers.  Scooters.  Crutches.  Turn on the lights when you go into a dark area. Replace any light bulbs as soon as they burn out.  Set up your furniture so you have a clear path. Avoid moving your furniture around.  If any of your floors are uneven, fix them.  If there are any pets around you, be aware of where they are.  Review your medicines with your doctor. Some medicines can make you feel dizzy. This can increase your chance of falling. Ask your doctor what other things that you can do to help prevent falls. This information is not intended to replace advice given to you by your health care provider. Make sure you discuss any questions you have with your health care provider. Document Released: 03/22/2009 Document Revised: 11/01/2015 Document Reviewed: 06/30/2014 Elsevier Interactive Patient Education  2017 [employer].

## 2020-09-21 ENCOUNTER — Other Ambulatory Visit: Payer: Self-pay | Admitting: Cardiovascular Disease

## 2020-11-27 LAB — PSA: PSA: 4.78

## 2021-01-08 ENCOUNTER — Encounter: Payer: Self-pay | Admitting: Adult Health

## 2021-01-20 ENCOUNTER — Other Ambulatory Visit: Payer: Self-pay | Admitting: Cardiovascular Disease

## 2021-01-23 ENCOUNTER — Other Ambulatory Visit: Payer: Self-pay | Admitting: Cardiovascular Disease

## 2021-01-23 NOTE — Telephone Encounter (Signed)
Rx(s) sent to pharmacy electronically.  

## 2021-02-14 ENCOUNTER — Encounter: Payer: Self-pay | Admitting: Gastroenterology

## 2021-03-14 ENCOUNTER — Other Ambulatory Visit: Payer: Self-pay | Admitting: Cardiovascular Disease

## 2021-03-20 ENCOUNTER — Other Ambulatory Visit: Payer: Self-pay | Admitting: Cardiovascular Disease

## 2021-04-12 ENCOUNTER — Encounter: Payer: Self-pay | Admitting: Cardiovascular Disease

## 2021-04-12 ENCOUNTER — Other Ambulatory Visit: Payer: Self-pay

## 2021-04-12 ENCOUNTER — Ambulatory Visit: Payer: Medicare Other | Admitting: Cardiovascular Disease

## 2021-04-12 DIAGNOSIS — E782 Mixed hyperlipidemia: Secondary | ICD-10-CM

## 2021-04-12 DIAGNOSIS — I2111 ST elevation (STEMI) myocardial infarction involving right coronary artery: Secondary | ICD-10-CM | POA: Diagnosis not present

## 2021-04-12 DIAGNOSIS — I10 Essential (primary) hypertension: Secondary | ICD-10-CM | POA: Diagnosis not present

## 2021-04-12 NOTE — Patient Instructions (Signed)
Medication Instructions:  Your physician recommends that you continue on your current medications as directed. Please refer to the Current Medication list given to you today.  *If you need a refill on your cardiac medications before your next appointment, please call your pharmacy*   Testing/Procedures: Your physician has requested that you have a carotid duplex. This test is an ultrasound of the carotid arteries in your neck. It looks at blood flow through these arteries that supply the brain with blood. Allow one hour for this exam. There are no restrictions or special instructions. This procedure is done at [address]    Follow-Up: At [facility], you and your health needs are our priority.  As part of our continuing mission to provide you with exceptional heart care, we have created designated Provider Care Teams.  These Care Teams include your primary Cardiologist (physician) and Advanced Practice Providers (APPs -  Physician Assistants and Nurse Practitioners) who all work together to provide you with the care you need, when you need it.  We recommend signing up for the patient portal called "MyChart".  Sign up information is provided on this After Visit Summary.  MyChart is used to connect with patients for Virtual Visits (Telemedicine).  Patients are able to view lab/test results, encounter notes, upcoming appointments, etc.  Non-urgent messages can be sent to your provider as well.   To learn more about what you can do with MyChart, go to [email or web address].    Your next appointment:   12 month(s)  The format for your next appointment:   In Person  Provider:   Jonathan Berry, MD 

## 2021-04-12 NOTE — Assessment & Plan Note (Signed)
History of hyperlipidemia on statin therapy with lipid profile performed 03/07/2020 revealing total cholesterol 149, LDL 58 and HDL of 75.

## 2021-04-12 NOTE — Assessment & Plan Note (Signed)
History of essential hypertension a blood pressure measured today 136/88.  He is on lisinopril.

## 2021-04-12 NOTE — Assessment & Plan Note (Signed)
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  I brought him to the Cath Lab at 2:00 in the morning demonstrated an occluded dominant RCA which I stented.  He had circumflex LAD and diagonal branch disease as well which I intervened on in a staged fashion 3 days later.  He is on dual antiplatelet therapy.  He denies chest pain or shortness of breath.

## 2021-04-12 NOTE — Progress Notes (Signed)
04/12/2021 Leta Jungling   04/26/1951  [id number]  Primary Physician Pcp, No Primary Cardiologist: Lorretta Harp MD Renae Gloss  HPI:  Blake Evans is a 70 y.o.    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 I last saw in the office 03/07/2020.  His cardiovascular risk factor profile is positive for hyperlipidemia and a family history of a father that had bypass surgery. 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 I brought him to the cath lab at 2:00 in the morning and it demonstrated an occluded dominant distal RCA which I stented. He had circumflex and LAD diagonal branch disease as well. I brought him back 3 days later and in a staged fashion stented his distal LAD diagonal branch as well as his mid AV groove circumflex and he was discharged the following day. He feels well and has had no recurrent symptoms.  He exercises frequently without any limitation including walking and playing golf..  He has modified his diet and participated in cardiac rehab.   Since I saw him a year ago he continues to do well.  He does exercise frequently during the week.  He is totally asymptomatic.   Current Meds  Medication Sig   aspirin 81 MG tablet Take 81 mg by mouth 2 (two) times daily.    atorvastatin (LIPITOR) 40 MG tablet TAKE 1 TABLET BY MOUTH EVERY DAY   clopidogrel (PLAVIX) 75 MG tablet TAKE 1 TABLET (75 MG TOTAL) BY MOUTH DAILY. KEEP UPCOMING VISIT.   finasteride (PROSCAR) 5 MG tablet Take 1 tablet by mouth daily.   lisinopril (ZESTRIL) 2.5 MG tablet TAKE 1 TABLET BY MOUTH EVERY DAY   Multiple Vitamin (MULTIVITAMIN) tablet Take 1 tablet by mouth daily.   saw palmetto 80 MG capsule Take 80 mg by mouth daily.     No Known Allergies  Social History   Socioeconomic History   Marital status: Married    Spouse  name: Not on file   Number of children: Not on file   Years of education: Not on file   Highest education level: Not on file  Occupational History   Not on file  Tobacco Use   Smoking status: Never   Smokeless tobacco: Never  Substance and Sexual Activity   Alcohol use: Yes    Alcohol/week: 0.0 standard drinks    Comment: occasional   Drug use: No   Sexual activity: Yes    Birth control/protection: None  Other Topics Concern   Not on file  Social History Narrative   Retired as a Optometrist in [employer]   Married x 8 months   Two grown children - both live in [place]       Social Determinants of Health   Financial Resource Strain: Low Risk    Difficulty of Paying Living Expenses: Not hard at all  Food Insecurity: No Food Insecurity   Worried About Charity fundraiser in the Last Year: Never true   Arboriculturist in the Last Year: Never true  Transportation Needs: No Transportation Needs   Lack of Transportation (Medical): No   Lack of Transportation (Non-Medical): No  Physical Activity: Sufficiently Active   Days of Exercise per Week: 7 days   Minutes of Exercise per Session: 40 min  Stress: No Stress Concern Present  Feeling of Stress : Only a little  Social Connections: Engineer, building services of Communication with Friends and Family: Twice a week   Frequency of Social Gatherings with Friends and Family: Twice a week   Attends Religious Services: 1 to 4 times per year   Active Member of [employer] or Organizations: Yes   Attends Music therapist: More than 4 times per year   Marital Status: Married  Human resources officer Violence: Not At Risk   Fear of Current or Ex-Partner: No   Emotionally Abused: No   Physically Abused: No   Sexually Abused: No     Review of Systems: General: negative for chills, fever, night sweats or weight changes.  Cardiovascular: negative for chest pain, dyspnea on exertion, edema, orthopnea, palpitations, paroxysmal  nocturnal dyspnea or shortness of breath Dermatological: negative for rash Respiratory: negative for cough or wheezing Urologic: negative for hematuria Abdominal: negative for nausea, vomiting, diarrhea, bright red blood per rectum, melena, or hematemesis Neurologic: negative for visual changes, syncope, or dizziness All other systems reviewed and are otherwise negative except as noted above.    Blood pressure 136/88, pulse (!) 51, height 5\' 11"  (1.803 m), weight 156 lb 6.4 oz (70.9 kg), SpO2 97 %.  General appearance: alert and no distress Neck: no adenopathy, no JVD, supple, symmetrical, trachea midline, thyroid not enlarged, symmetric, no tenderness/mass/nodules, and left carotid bruit Lungs: clear to auscultation bilaterally Heart: regular rate and rhythm, S1, S2 normal, no murmur, click, rub or gallop Extremities: extremities normal, atraumatic, no cyanosis or edema Pulses: 2+ and symmetric Skin: Skin color, texture, turgor normal. No rashes or lesions Neurologic: Grossly normal  EKG sinus bradycardia 51 with septal Q waves.  He also had small inferior Q waves.  I personally reviewed this EKG.  ASSESSMENT AND PLAN:   Hyperlipidemia History of hyperlipidemia on statin therapy with lipid profile performed 03/07/2020 revealing total cholesterol 149, LDL 58 and HDL of 75.  STEMI (ST elevation myocardial infarction), ST elevation in inf. 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  I brought him to the Cath Lab at 2:00 in the morning demonstrated an occluded dominant RCA which I stented.  He had circumflex LAD and diagonal branch disease as well which I intervened on in a staged fashion 3 days later.  He is on dual antiplatelet therapy.  He denies chest pain or shortness of breath.  Essential hypertension History of essential hypertension a blood pressure measured today 136/88.  He is on lisinopril.     Lorretta Harp MD FACP,FACC,FAHA,  [facility] 04/12/2021 10:21 AM

## 2021-04-15 ENCOUNTER — Other Ambulatory Visit: Payer: Self-pay | Admitting: Cardiovascular Disease

## 2021-04-15 ENCOUNTER — Ambulatory Visit (HOSPITAL_COMMUNITY)
Admission: RE | Admit: 2021-04-15 | Discharge: 2021-04-15 | Disposition: A | Discharge status: Home / Self Care | Payer: Medicare Other | Source: Ambulatory Visit | Attending: Cardiology | Admitting: Cardiology

## 2021-04-15 ENCOUNTER — Other Ambulatory Visit: Payer: Self-pay

## 2021-04-15 DIAGNOSIS — I6522 Occlusion and stenosis of left carotid artery: Secondary | ICD-10-CM

## 2021-04-15 DIAGNOSIS — I2111 ST elevation (STEMI) myocardial infarction involving right coronary artery: Secondary | ICD-10-CM | POA: Insufficient documentation

## 2021-04-15 DIAGNOSIS — I10 Essential (primary) hypertension: Secondary | ICD-10-CM | POA: Insufficient documentation

## 2021-04-15 DIAGNOSIS — E782 Mixed hyperlipidemia: Secondary | ICD-10-CM | POA: Insufficient documentation

## 2021-04-19 ENCOUNTER — Other Ambulatory Visit: Payer: Self-pay | Admitting: Cardiovascular Disease

## 2021-04-26 ENCOUNTER — Other Ambulatory Visit: Payer: Self-pay | Admitting: Cardiovascular Disease

## 2021-07-25 ENCOUNTER — Other Ambulatory Visit: Payer: Self-pay | Admitting: Cardiovascular Disease

## 2021-08-22 ENCOUNTER — Other Ambulatory Visit: Payer: Self-pay | Admitting: Internal Medicine

## 2021-10-20 ENCOUNTER — Other Ambulatory Visit: Payer: Self-pay | Admitting: Cardiovascular Disease

## 2021-11-01 ENCOUNTER — Other Ambulatory Visit: Payer: Self-pay | Admitting: Cardiovascular Disease

## 2022-01-23 ENCOUNTER — Other Ambulatory Visit: Payer: Self-pay | Admitting: Cardiovascular Disease

## 2022-02-06 ENCOUNTER — Ambulatory Visit
Admission: RE | Admit: 2022-02-06 | Discharge: 2022-02-06 | Disposition: A | Discharge status: Home / Self Care | Payer: Medicare Other | Source: Ambulatory Visit | Attending: Internal Medicine | Admitting: Internal Medicine

## 2022-02-06 DIAGNOSIS — Z8781 Personal history of (healed) traumatic fracture: Secondary | ICD-10-CM

## 2022-03-09 ENCOUNTER — Other Ambulatory Visit: Payer: Self-pay | Admitting: Cardiovascular Disease

## 2022-05-03 ENCOUNTER — Other Ambulatory Visit: Payer: Self-pay | Admitting: Cardiovascular Disease

## 2022-05-18 ENCOUNTER — Other Ambulatory Visit: Payer: Self-pay | Admitting: Cardiovascular Disease

## 2022-05-22 ENCOUNTER — Encounter: Payer: Self-pay | Admitting: Cardiovascular Disease

## 2022-05-22 ENCOUNTER — Ambulatory Visit: Payer: Medicare Other | Attending: Cardiovascular Disease | Admitting: Cardiovascular Disease

## 2022-05-22 VITALS — BP 138/60 | HR 50 | Ht 71.0 in | Wt 159.4 lb

## 2022-05-22 DIAGNOSIS — I6522 Occlusion and stenosis of left carotid artery: Secondary | ICD-10-CM

## 2022-05-22 DIAGNOSIS — E782 Mixed hyperlipidemia: Secondary | ICD-10-CM | POA: Diagnosis not present

## 2022-05-22 DIAGNOSIS — I10 Essential (primary) hypertension: Secondary | ICD-10-CM | POA: Diagnosis not present

## 2022-05-22 DIAGNOSIS — I2111 ST elevation (STEMI) myocardial infarction involving right coronary artery: Secondary | ICD-10-CM | POA: Diagnosis not present

## 2022-05-22 DIAGNOSIS — I779 Disorder of arteries and arterioles, unspecified: Secondary | ICD-10-CM | POA: Insufficient documentation

## 2022-05-22 NOTE — Patient Instructions (Signed)
Medication Instructions:  The current medical regimen is effective;  continue present plan and medications.  *If you need a refill on your cardiac medications before your next appointment, please call your pharmacy*   Testing/Procedures: Your physician has requested that you have a carotid duplex. This test is an ultrasound of the carotid arteries in your neck. It looks at blood flow through these arteries that supply the brain with blood. Allow one hour for this exam. There are no restrictions or special instructions.   Follow-Up: At [facility], you and your health needs are our priority.  As part of our continuing mission to provide you with exceptional heart care, we have created designated Provider Care Teams.  These Care Teams include your primary Cardiologist (physician) and Advanced Practice Providers (APPs -  Physician Assistants and Nurse Practitioners) who all work together to provide you with the care you need, when you need it.  We recommend signing up for the patient portal called "MyChart".  Sign up information is provided on this After Visit Summary.  MyChart is used to connect with patients for Virtual Visits (Telemedicine).  Patients are able to view lab/test results, encounter notes, upcoming appointments, etc.  Non-urgent messages can be sent to your provider as well.   To learn more about what you can do with MyChart, go to [email or web address].    Your next appointment:   12 month(s)  The format for your next appointment:   In Person  Provider:   Quay Burow, MD

## 2022-05-22 NOTE — Assessment & Plan Note (Signed)
History of CAD status post inferior STEMI 11/21/2011.  Brought him to the Cath Lab at 2:00 in the morning demonstrating an occluded dominant RCA which I stented.  He had circumflex, LAD and diagonal branch disease which I treated in a staged fashion 3 days later.  He is active, completely asymptomatic and remains on dual antiplatelet therapy.

## 2022-05-22 NOTE — Assessment & Plan Note (Signed)
History of left moderate ICA stenosis by duplex ultrasound performed 04/15/2021.  He does have a left carotid bruit.  Will repeat carotid Doppler studies.

## 2022-05-22 NOTE — Progress Notes (Signed)
05/22/2022 Blake Evans   December 22, 1950  [id number]  Primary Physician Jacalyn Lefevre, Jesse Sans, MD Primary Cardiologist: Lorretta Harp MD [name], [place]  HPI:  Blake Evans is a 71 y.o.    thin and fit-appearing, divorced Caucasian male, father of 2 children who was the Mudlogger of a nonprofit in [place] and currently does [name] work here in [place] at the [employer].. I last saw in the office 04/12/21.  His cardiovascular risk factor profile is positive for hyperlipidemia and a family history of a father that had bypass surgery. 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 I brought him to the cath lab at 2:00 in the morning and it demonstrated an occluded dominant distal RCA which I stented. He had circumflex and LAD diagonal branch disease as well. I brought him back 3 days later and in a staged fashion stented his distal LAD diagonal branch as well as his mid AV groove circumflex and he was discharged the following day. He feels well and has had no recurrent symptoms.  He exercises frequently without any limitation including walking and playing golf..  He has modified his diet and participated in cardiac rehab.   Since I saw him a year ago he continues to do well.  He does exercise frequently during the week.  He is totally asymptomatic.   Current Meds  Medication Sig   alendronate (FOSAMAX) 70 MG tablet Take 70 mg by mouth once a week.   aspirin 81 MG tablet Take 81 mg by mouth 2 (two) times daily.    atorvastatin (LIPITOR) 40 MG tablet TAKE 1 TABLET BY MOUTH EVERY DAY   cholecalciferol (VITAMIN D3) 25 MCG (1000 UNIT) tablet Take 1,000 Units by mouth daily.   clopidogrel (PLAVIX) 75 MG tablet TAKE 1 TABLET (75 MG TOTAL) BY MOUTH DAILY. KEEP UPCOMING VISIT.   finasteride (PROSCAR) 5 MG tablet Take 1 tablet by mouth daily.   losartan (COZAAR) 25 MG tablet Take 25 mg by mouth daily.   Multiple Vitamin (MULTIVITAMIN) tablet  Take 1 tablet by mouth daily.   nitroGLYCERIN (NITROSTAT) 0.4 MG SL tablet PLACE 1 TABLET (0.4 MG TOTAL) UNDER THE TONGUE EVERY 5 (FIVE) MINUTES X 3 DOSES AS NEEDED FOR CHEST PAIN.   saw palmetto 80 MG capsule Take 80 mg by mouth daily.   tamsulosin (FLOMAX) 0.4 MG CAPS capsule Take 0.4 mg by mouth daily.     No Known Allergies  Social History   Socioeconomic History   Marital status: Married    Spouse name: Not on file   Number of children: Not on file   Years of education: Not on file   Highest education level: Not on file  Occupational History   Not on file  Tobacco Use   Smoking status: Never   Smokeless tobacco: Never  Substance and Sexual Activity   Alcohol use: Yes    Alcohol/week: 0.0 standard drinks of alcohol    Comment: occasional   Drug use: No   Sexual activity: Yes    Birth control/protection: None  Other Topics Concern   Not on file  Social History Narrative   Retired as a Optometrist in [employer]   Married x 8 months   Two grown children - both live in [place]       Social Determinants of Health   Financial Resource Strain: Low Risk  (08/03/2020)   Overall Financial Resource Strain (CARDIA)  Difficulty of Paying Living Expenses: Not hard at all  Food Insecurity: No Food Insecurity (08/03/2020)   Hunger Vital Sign    Worried About Running Out of Food in the Last Year: Never true    Ran Out of Food in the Last Year: Never true  Transportation Needs: No Transportation Needs (08/03/2020)   PRAPARE - Hydrologist (Medical): No    Lack of Transportation (Non-Medical): No  Physical Activity: Sufficiently Active (08/03/2020)   Exercise Vital Sign    Days of Exercise per Week: 7 days    Minutes of Exercise per Session: 40 min  Stress: No Stress Concern Present (08/03/2020)   [place]    Feeling of Stress : Only a little  Social Connections: Socially  Integrated (08/03/2020)   Social Connection and Isolation Panel [NHANES]    Frequency of Communication with Friends and Family: Twice a week    Frequency of Social Gatherings with Friends and Family: Twice a week    Attends Religious Services: 1 to 4 times per year    Active Member of [employer] or Organizations: Yes    Attends Music therapist: More than 4 times per year    Marital Status: Married  Human resources officer Violence: Not At Risk (08/03/2020)   Humiliation, Afraid, Rape, and Kick questionnaire    Fear of Current or Ex-Partner: No    Emotionally Abused: No    Physically Abused: No    Sexually Abused: No     Review of Systems: General: negative for chills, fever, night sweats or weight changes.  Cardiovascular: negative for chest pain, dyspnea on exertion, edema, orthopnea, palpitations, paroxysmal nocturnal dyspnea or shortness of breath Dermatological: negative for rash Respiratory: negative for cough or wheezing Urologic: negative for hematuria Abdominal: negative for nausea, vomiting, diarrhea, bright red blood per rectum, melena, or hematemesis Neurologic: negative for visual changes, syncope, or dizziness All other systems reviewed and are otherwise negative except as noted above.    Blood pressure 138/60, pulse (!) 50, height '5\' 11"'$  (1.803 m), weight 159 lb 6.4 oz (72.3 kg), SpO2 98 %.  General appearance: alert and no distress Neck: no adenopathy, no JVD, supple, symmetrical, trachea midline, thyroid not enlarged, symmetric, no tenderness/mass/nodules, and soft left carotid bruit Lungs: clear to auscultation bilaterally Heart: regular rate and rhythm, S1, S2 normal, no murmur, click, rub or gallop Extremities: extremities normal, atraumatic, no cyanosis or edema Pulses: 2+ and symmetric Skin: Skin color, texture, turgor normal. No rashes or lesions Neurologic: Grossly normal  EKG sinus bradycardia 50 with first-degree AV block.  I personally reviewed this  EKG.  ASSESSMENT AND PLAN:   Hyperlipidemia History of hyperlipidemia on statin therapy with lipid profile performed 08/14/2021 revealing total cholesterol 131, LDL 50 and HDL at 66.  STEMI (ST elevation myocardial infarction), ST elevation in inf. wall, s/p DES resolute to RCA 11/21/11 History of CAD status post inferior STEMI 11/21/2011.  Brought him to the Cath Lab at 2:00 in the morning demonstrating an occluded dominant RCA which I stented.  He had circumflex, LAD and diagonal branch disease which I treated in a staged fashion 3 days later.  He is active, completely asymptomatic and remains on dual antiplatelet therapy.  Essential hypertension History of essential hypertension a blood pressure measured today at 138/60.  He is on losartan.  Carotid artery disease ([place]) History of left moderate ICA stenosis by duplex ultrasound performed 04/15/2021.  He does  have a left carotid bruit.  Will repeat carotid Doppler studies.     Lorretta Harp MD FACP,FACC,FAHA, [facility] 05/22/2022 10:35 AM

## 2022-05-22 NOTE — Assessment & Plan Note (Signed)
History of essential hypertension a blood pressure measured today at 138/60.  He is on losartan.

## 2022-05-22 NOTE — Assessment & Plan Note (Signed)
History of hyperlipidemia on statin therapy with lipid profile performed 08/14/2021 revealing total cholesterol 131, LDL 50 and HDL at 66.

## 2022-05-28 ENCOUNTER — Ambulatory Visit (HOSPITAL_COMMUNITY)
Admission: RE | Admit: 2022-05-28 | Discharge: 2022-05-28 | Disposition: A | Discharge status: Home / Self Care | Payer: Medicare Other | Source: Ambulatory Visit | Attending: Cardiovascular Disease | Admitting: Cardiovascular Disease

## 2022-05-28 DIAGNOSIS — E782 Mixed hyperlipidemia: Secondary | ICD-10-CM | POA: Insufficient documentation

## 2022-05-28 DIAGNOSIS — I2111 ST elevation (STEMI) myocardial infarction involving right coronary artery: Secondary | ICD-10-CM | POA: Diagnosis not present

## 2022-05-28 DIAGNOSIS — I10 Essential (primary) hypertension: Secondary | ICD-10-CM | POA: Diagnosis not present

## 2022-05-28 DIAGNOSIS — I6523 Occlusion and stenosis of bilateral carotid arteries: Secondary | ICD-10-CM

## 2022-07-01 ENCOUNTER — Ambulatory Visit: Payer: Medicare Other | Admitting: Cardiovascular Disease

## 2022-11-18 ENCOUNTER — Other Ambulatory Visit: Payer: Self-pay | Admitting: Cardiovascular Disease

## 2023-05-26 ENCOUNTER — Ambulatory Visit: Payer: Medicare Other | Attending: Cardiovascular Disease | Admitting: Cardiovascular Disease

## 2023-05-26 ENCOUNTER — Encounter: Payer: Self-pay | Admitting: Cardiovascular Disease

## 2023-05-26 VITALS — BP 90/60 | HR 72 | Ht 71.0 in | Wt 161.0 lb

## 2023-05-26 DIAGNOSIS — E782 Mixed hyperlipidemia: Secondary | ICD-10-CM

## 2023-05-26 DIAGNOSIS — I10 Essential (primary) hypertension: Secondary | ICD-10-CM | POA: Diagnosis not present

## 2023-05-26 DIAGNOSIS — I2111 ST elevation (STEMI) myocardial infarction involving right coronary artery: Secondary | ICD-10-CM | POA: Diagnosis not present

## 2023-05-26 DIAGNOSIS — I6522 Occlusion and stenosis of left carotid artery: Secondary | ICD-10-CM

## 2023-05-26 NOTE — Assessment & Plan Note (Signed)
History of CAD status post inferior STEMI 11/21/2011.  I brought him to the Cath Lab at 2:00 in the morning demonstrated an occluded dominant distal RCA which I stented.  He had circumflex and LAD/diagonal branch disease as well.  I brought him back 3 days later in a staged fashion and stented his distal LAD diagonal branch as well as his mid AV groove circumflex.  He has been asymptomatic since and fairly active.  He remains on dual antiplatelet therapy.

## 2023-05-26 NOTE — Assessment & Plan Note (Signed)
History of essential hypertension blood pressure measured today at 90/60.  He says he checks his blood pressure at home and it usually in the low 100 range.  He is on losartan

## 2023-05-26 NOTE — Progress Notes (Signed)
05/26/2023 Blake Evans   1951/05/03  [id number]  Primary Physician Nadene Rubins, Nyoka Cowden, MD Primary Cardiologist: [name] MD Nicholes Calamity, [place]  HPI:  Blake Evans is a 72 y.o.  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  He is currently doing Agricultural consultant work for [employer].. I last saw in the office 05/22/2022.  His cardiovascular risk factor profile is positive for hyperlipidemia and a family history of a father that had bypass surgery. 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 I brought him to the cath lab at 2:00 in the morning and it demonstrated an occluded dominant distal RCA which I stented. He had circumflex and LAD diagonal branch disease as well. I brought him back 3 days later and in a staged fashion stented his distal LAD diagonal branch as well as his mid AV groove circumflex and he was discharged the following day. He feels well and has had no recurrent symptoms.  He exercises frequently without any limitation including walking and playing golf..  He has modified his diet and participated in cardiac rehab.   Since I saw him a year ago he continues to do well.  He does exercise frequently during the week.  He is totally asymptomatic.  He does relate some orthopedic problems involving his back and legs.   Current Meds  Medication Sig   alendronate (FOSAMAX) 70 MG tablet Take 70 mg by mouth once a week.   aspirin 81 MG tablet Take 81 mg by mouth 2 (two) times daily.    atorvastatin (LIPITOR) 40 MG tablet TAKE 1 TABLET BY MOUTH EVERY DAY   cholecalciferol (VITAMIN D3) 25 MCG (1000 UNIT) tablet Take 1,000 Units by mouth daily.   clopidogrel (PLAVIX) 75 MG tablet TAKE 1 TABLET (75 MG TOTAL) BY MOUTH DAILY. KEEP UPCOMING VISIT.   finasteride (PROSCAR) 5 MG tablet Take 1 tablet  by mouth daily.   losartan (COZAAR) 25 MG tablet Take 25 mg by mouth daily.   Multiple Vitamin (MULTIVITAMIN) tablet Take 1 tablet by mouth daily.   nitroGLYCERIN (NITROSTAT) 0.4 MG SL tablet PLACE 1 TABLET (0.4 MG TOTAL) UNDER THE TONGUE EVERY 5 (FIVE) MINUTES X 3 DOSES AS NEEDED FOR CHEST PAIN.   saw palmetto 80 MG capsule Take 80 mg by mouth daily.   tamsulosin (FLOMAX) 0.4 MG CAPS capsule Take 0.4 mg by mouth daily.     No Known Allergies  Social History   Socioeconomic History   Marital status: Married    Spouse name: Not on file   Number of children: Not on file   Years of education: Not on file   Highest education level: Not on file  Occupational History   Not on file  Tobacco Use   Smoking status: Never   Smokeless tobacco: Never  Substance and Sexual Activity   Alcohol use: Yes    Alcohol/week: 0.0 standard drinks of alcohol    Comment: occasional   Drug use: No   Sexual activity: Yes    Birth control/protection: None  Other Topics Concern   Not on file  Social History Narrative   Retired as a Research scientist (medical) in [employer]   Married x 8 months   Two grown children - both live in [place]       Social Drivers of Health  Financial Resource Strain: Low Risk  (08/09/2021)   Received from [facility], [facility]   Overall Financial Resource Strain (CARDIA)    Difficulty of Paying Living Expenses: Not hard at all  Food Insecurity: No Food Insecurity (08/09/2021)   Received from [facility], [facility]   Hunger Vital Sign    Worried About Running Out of Food in the Last Year: Never true    Ran Out of Food in the Last Year: Never true  Transportation Needs: No Transportation Needs (08/09/2021)   Received from [facility], [facility]   [facility] - Transportation    In the past 12 months, has lack of transportation kept you from medical appointments or from  getting medications?: No    Lack of Transportation (Non-Medical): No  Physical Activity: Sufficiently Active (08/03/2020)   Exercise Vital Sign    Days of Exercise per Week: 7 days    Minutes of Exercise per Session: 40 min  Stress: No Stress Concern Present (08/03/2020)   [employer] of Occupational Health - Occupational Stress Questionnaire    Feeling of Stress : Only a little  Social Connections: Socially Integrated (08/03/2020)   Social Connection and Isolation Panel [NHANES]    Frequency of Communication with Friends and Family: Twice a week    Frequency of Social Gatherings with Friends and Family: Twice a week    Attends Religious Services: 1 to 4 times per year    Active Member of [employer] or Organizations: Yes    Attends Engineer, structural: More than 4 times per year    Marital Status: Married  Catering manager Violence: Not At Risk (08/03/2020)   Humiliation, Afraid, Rape, and Kick questionnaire    Fear of Current or Ex-Partner: No    Emotionally Abused: No    Physically Abused: No    Sexually Abused: No     Review of Systems: General: negative for chills, fever, night sweats or weight changes.  Cardiovascular: negative for chest pain, dyspnea on exertion, edema, orthopnea, palpitations, paroxysmal nocturnal dyspnea or shortness of breath Dermatological: negative for rash Respiratory: negative for cough or wheezing Urologic: negative for hematuria Abdominal: negative for nausea, vomiting, diarrhea, bright red blood per rectum, melena, or hematemesis Neurologic: negative for visual changes, syncope, or dizziness All other systems reviewed and are otherwise negative except as noted above.    Blood pressure 90/60, pulse 72, height 5\' 11"  (1.803 m), weight 161 lb (73 kg), SpO2 98%.  General appearance: alert and no distress Neck: no adenopathy, no carotid bruit, no JVD, supple, symmetrical, trachea midline, and thyroid not enlarged, symmetric, no  tenderness/mass/nodules Lungs: clear to auscultation bilaterally Heart: regular rate and rhythm, S1, S2 normal, no murmur, click, rub or gallop Extremities: extremities normal, atraumatic, no cyanosis or edema Pulses: 2+ and symmetric Skin: Skin color, texture, turgor normal. No rashes or lesions Neurologic: Grossly normal  EKG EKG Interpretation Date/Time:  Tuesday May 26 2023 11:36:19 EST Ventricular Rate:  68 PR Interval:  240 QRS Duration:  84 QT Interval:  374 QTC Calculation: 397 R Axis:   71  Text Interpretation: Sinus rhythm with 1st degree A-V block When compared with ECG of 25-Nov-2011 04:24, T wave inversion no longer evident in Inferior leads Nonspecific T wave abnormality no longer evident in Lateral leads Confirmed by Nanetta Batty [phone]) on 05/26/2023 12:04:29 PM    ASSESSMENT AND PLAN:   Hyperlipidemia History of hyperlipidemia on statin  therapy with lipid profile performed 08/14/2021 revealing total cholesterol 131, LDL of 50 and HDL of 66.  We will recheck a lipid liver profile today.  STEMI (ST elevation myocardial infarction), ST elevation in inf. wall, s/p DES resolute to RCA 11/21/11 History of CAD status post inferior STEMI 11/21/2011.  I brought him to the Cath Lab at 2:00 in the morning demonstrated an occluded dominant distal RCA which I stented.  He had circumflex and LAD/diagonal branch disease as well.  I brought him back 3 days later in a staged fashion and stented his distal LAD diagonal branch as well as his mid AV groove circumflex.  He has been asymptomatic since and fairly active.  He remains on dual antiplatelet therapy.  Essential hypertension History of essential hypertension blood pressure measured today at 90/60.  He says he checks his blood pressure at home and it usually in the low 100 range.  He is on losartan  Carotid artery disease (HCC) History of moderate left ICA stenosis by duplex ultrasound performed 05/28/2022.  This will be  repeated.     [name] MD FACP,FACC,FAHA, [facility] 05/26/2023 12:11 PM

## 2023-05-26 NOTE — Patient Instructions (Addendum)
Medication Instructions:  Your physician recommends that you continue on your current medications as directed. Please refer to the Current Medication list given to you today.  *If you need a refill on your cardiac medications before your next appointment, please call your pharmacy*   Lab Work: Your physician recommends that you have labs drawn today: Lipid/liver panel  If you have labs (blood work) drawn today and your tests are completely normal, you will receive your results only by: MyChart Message (if you have MyChart) OR A paper copy in the mail If you have any lab test that is abnormal or we need to change your treatment, we will call you to review the results.   Testing/Procedures: Your physician has requested that you have a carotid duplex. This test is an ultrasound of the carotid arteries in your neck. It looks at blood flow through these arteries that supply the brain with blood. Allow one hour for this exam. There are no restrictions or special instructions. This will take place at [address] [facility].  Please note: We ask at that you not bring children with you during ultrasound (echo/ vascular) testing. Due to room size and safety concerns, children are not allowed in the ultrasound rooms during exams. Our front office staff cannot provide observation of children in our lobby area while testing is being conducted. An adult accompanying a patient to their appointment will only be allowed in the ultrasound room at the discretion of the ultrasound technician under special circumstances. We apologize for any inconvenience.    Follow-Up: At [facility], you and your health needs are our priority.  As part of our continuing mission to provide you with exceptional heart care, we have created designated Provider Care Teams.  These Care Teams include your primary Cardiologist (physician) and Advanced Practice Providers (APPs -  Physician Assistants and Nurse  Practitioners) who all work together to provide you with the care you need, when you need it.  We recommend signing up for the patient portal called "MyChart".  Sign up information is provided on this After Visit Summary.  MyChart is used to connect with patients for Virtual Visits (Telemedicine).  Patients are able to view lab/test results, encounter notes, upcoming appointments, etc.  Non-urgent messages can be sent to your provider as well.   To learn more about what you can do with MyChart, go to [email or web address].    Your next appointment:   12 month(s)  Provider:   Nanetta Batty, MD

## 2023-05-26 NOTE — Assessment & Plan Note (Signed)
History of hyperlipidemia on statin therapy with lipid profile performed 08/14/2021 revealing total cholesterol 131, LDL of 50 and HDL of 66.  We will recheck a lipid liver profile today.

## 2023-05-26 NOTE — Assessment & Plan Note (Signed)
History of moderate left ICA stenosis by duplex ultrasound performed 05/28/2022.  This will be repeated.

## 2023-05-27 LAB — HEPATIC FUNCTION PANEL
ALT: 23 [IU]/L (ref 0–44)
AST: 24 [IU]/L (ref 0–40)
Albumin: 4.4 g/dL (ref 3.8–4.8)
Alkaline Phosphatase: 48 [IU]/L (ref 44–121)
Bilirubin Total: 1.7 mg/dL — ABNORMAL HIGH (ref 0.0–1.2)
Bilirubin, Direct: 0.48 mg/dL — ABNORMAL HIGH (ref 0.00–0.40)
Total Protein: 6.4 g/dL (ref 6.0–8.5)

## 2023-05-27 LAB — LIPID PANEL
Chol/HDL Ratio: 2.4 {ratio} (ref 0.0–5.0)
Cholesterol, Total: 153 mg/dL (ref 100–199)
HDL: 64 mg/dL (ref >39)
LDL Chol Calc (NIH): 68 mg/dL (ref 0–99)
Triglycerides: 119 mg/dL (ref 0–149)
VLDL Cholesterol Cal: 21 mg/dL (ref 5–40)

## 2023-06-19 ENCOUNTER — Telehealth: Payer: Self-pay | Admitting: Cardiovascular Disease

## 2023-06-19 NOTE — Telephone Encounter (Signed)
*  STAT* If patient is at the pharmacy, call can be transferred to refill team.   1. Which medications need to be refilled? (please list name of each medication and dose if known)   clopidogrel  (PLAVIX ) 75 MG tablet     4. Which pharmacy/location (including street and city if local pharmacy) is medication to be sent to?   [id number] [address], [place] - [address] Phone: [phone]  Fax: [phone]      5. Do they need a 30 day or 90 day supply? 90

## 2023-06-22 ENCOUNTER — Ambulatory Visit (HOSPITAL_COMMUNITY)
Admission: RE | Admit: 2023-06-22 | Discharge: 2023-06-22 | Disposition: A | Discharge status: Home / Self Care | Payer: Medicare Other | Source: Ambulatory Visit | Attending: Cardiology | Admitting: Cardiology

## 2023-06-22 DIAGNOSIS — E782 Mixed hyperlipidemia: Secondary | ICD-10-CM | POA: Diagnosis not present

## 2023-06-22 DIAGNOSIS — I10 Essential (primary) hypertension: Secondary | ICD-10-CM | POA: Diagnosis not present

## 2023-06-22 DIAGNOSIS — I6522 Occlusion and stenosis of left carotid artery: Secondary | ICD-10-CM | POA: Diagnosis not present

## 2023-06-22 MED ORDER — CLOPIDOGREL BISULFATE 75 MG PO TABS: ORAL_TABLET | ORAL | 3 refills | Status: DC

## 2023-06-24 ENCOUNTER — Other Ambulatory Visit (HOSPITAL_COMMUNITY): Payer: Self-pay

## 2023-06-24 DIAGNOSIS — I6523 Occlusion and stenosis of bilateral carotid arteries: Secondary | ICD-10-CM

## 2023-06-24 DIAGNOSIS — I6522 Occlusion and stenosis of left carotid artery: Secondary | ICD-10-CM

## 2023-08-19 ENCOUNTER — Other Ambulatory Visit: Payer: Self-pay | Admitting: Cardiovascular Disease

## 2023-08-21 LAB — LAB REPORT - SCANNED: EGFR (Non-African Amer.): 94.8

## 2023-08-24 ENCOUNTER — Telehealth: Payer: Self-pay | Admitting: *Deleted

## 2023-08-24 ENCOUNTER — Ambulatory Visit: Admitting: Gastroenterology

## 2023-08-24 ENCOUNTER — Telehealth: Payer: Self-pay

## 2023-08-24 ENCOUNTER — Encounter: Payer: Self-pay | Admitting: Gastroenterology

## 2023-08-24 VITALS — BP 130/80 | HR 68 | Ht 71.0 in | Wt 163.0 lb

## 2023-08-24 DIAGNOSIS — Z09 Encounter for follow-up examination after completed treatment for conditions other than malignant neoplasm: Secondary | ICD-10-CM | POA: Diagnosis not present

## 2023-08-24 DIAGNOSIS — Z860101 Personal history of adenomatous and serrated colon polyps: Secondary | ICD-10-CM | POA: Diagnosis not present

## 2023-08-24 DIAGNOSIS — Z860100 Personal history of colon polyps, unspecified: Secondary | ICD-10-CM

## 2023-08-24 DIAGNOSIS — I2510 Atherosclerotic heart disease of native coronary artery without angina pectoris: Secondary | ICD-10-CM

## 2023-08-24 MED ORDER — SUFLAVE 178.7 G PO SOLR: 1.0000 | Freq: Once | ORAL | 0 refills | Status: AC

## 2023-08-24 NOTE — Telephone Encounter (Signed)
 [facility] Pre-operative Risk Assessment     Request for surgical clearance:     Endoscopy Procedure  What type of surgery is being performed?     Colonoscopy  When is this surgery scheduled?     09/30/23  What type of clearance is required ?   Pharmacy  Are there any medications that need to be held prior to surgery and how long? Plavix & 5 days  Practice name and name of physician performing surgery?      [facility]  What is your office phone and fax number?      Phone- [phone]  Fax- [phone]  Anesthesia type (None, local, MAC, general) ?       MAC   Please route your response to Morris Markham, CMA

## 2023-08-24 NOTE — Telephone Encounter (Signed)
 Pt has been scheduled tele preop appt 09/14/23. Med rec and consent are done.

## 2023-08-24 NOTE — Patient Instructions (Addendum)
 You have been scheduled for a colonoscopy. Please follow written instructions given to you at your visit today.   If you use inhalers (even only as needed), please bring them with you on the day of your procedure.  DO NOT TAKE 7 DAYS PRIOR TO TEST- Trulicity (dulaglutide) Ozempic, Wegovy (semaglutide) Mounjaro (tirzepatide) Bydureon Bcise (exanatide extended release)  DO NOT TAKE 1 DAY PRIOR TO YOUR TEST Rybelsus (semaglutide) Adlyxin (lixisenatide) Victoza (liraglutide) Byetta (exanatide) ___________________________________________________________________________  Bonita Quin will receive your bowel preparation through [facility], which ensures the lowest copay and home delivery, with outreach via text or call from an [phone] number. Please respond promptly to avoid rescheduling of your procedure. If you are interested in alternative options or have any questions regarding your prep, please contact them at [phone] ____________________________________________________________________________  Your Provider Has Sent Your Bowel Prep Regimen To [facility]   [facility] will contact you to verify your information and collect your copay, if applicable. Enjoy the comfort of your home while your prescription is mailed to you, FREE of any shipping charges.   [facility] accepts all major insurance benefits and applies discounts & coupons.  Have additional questions?   Chat: [email or web address] Call: [phone] Email: [email or web address] NCPDP: [id number]  How will [facility] contact you?  With a Welcome phone call,  a Welcome text and a checkout link in text form.  Texts you receive from [phone] Are NOT Spam.  *To set up delivery, you must complete the checkout process via link or speak to one of the patient care representatives. If [facility] is unable to reach you, your prescription may be delayed.  To avoid long hold times on the phone, you may also utilize the secure chat  feature on the [facility] website to request that they call you back for transaction completion or to expedite your concerns.  You will be contacted by our office prior to your procedure for directions on holding your Plavix.  If you do not hear from our office 1 week prior to your scheduled procedure, please call [phone] to discuss.   Due to recent changes in healthcare laws, you may see the results of your imaging and laboratory studies on MyChart before your provider has had a chance to review them.  We understand that in some cases there may be results that are confusing or concerning to you. Not all laboratory results come back in the same time frame and the provider may be waiting for multiple results in order to interpret others.  Please give [place] 48 hours in order for your provider to thoroughly review all the results before contacting the office for clarification of your results.   Thank you for trusting me with your gastrointestinal care!   Boone Master, PA

## 2023-08-24 NOTE — Telephone Encounter (Signed)
 Pt has been scheduled tele preop appt 09/14/23. Med rec and consent are done.     Patient Consent for Virtual Visit        Blake Evans has provided verbal consent on 08/24/2023 for a virtual visit (video or telephone).   CONSENT FOR VIRTUAL VISIT FOR:  Blake Evans  By participating in this virtual visit I agree to the following:  I hereby voluntarily request, consent and authorize [facility] and its employed or contracted physicians, physician assistants, nurse practitioners or other licensed health care professionals (the Practitioner), to provide me with telemedicine health care services (the "Services") as deemed necessary by the treating Practitioner. I acknowledge and consent to receive the Services by the Practitioner via telemedicine. I understand that the telemedicine visit will involve communicating with the Practitioner through live audiovisual communication technology and the disclosure of certain medical information by electronic transmission. I acknowledge that I have been given the opportunity to request an in-person assessment or other available alternative prior to the telemedicine visit and am voluntarily participating in the telemedicine visit.  I understand that I have the right to withhold or withdraw my consent to the use of telemedicine in the course of my care at any time, without affecting my right to future care or treatment, and that the Practitioner or I may terminate the telemedicine visit at any time. I understand that I have the right to inspect all information obtained and/or recorded in the course of the telemedicine visit and may receive copies of available information for a reasonable fee.  I understand that some of the potential risks of receiving the Services via telemedicine include:  Delay or interruption in medical evaluation due to technological equipment failure or disruption; Information transmitted may not be sufficient (e.g. poor  resolution of images) to allow for appropriate medical decision making by the Practitioner; and/or  In rare instances, security protocols could fail, causing a breach of personal health information.  Furthermore, I acknowledge that it is my responsibility to provide information about my medical history, conditions and care that is complete and accurate to the best of my ability. I acknowledge that Practitioner's advice, recommendations, and/or decision may be based on factors not within their control, such as incomplete or inaccurate data provided by me or distortions of diagnostic images or specimens that may result from electronic transmissions. I understand that the practice of medicine is not an exact science and that Practitioner makes no warranties or guarantees regarding treatment outcomes. I acknowledge that a copy of this consent can be made available to me via my patient portal [facility] MyChart), or I can request a printed copy by calling the office of [facility].    I understand that my insurance will be billed for this visit.   I have read or had this consent read to me. I understand the contents of this consent, which adequately explains the benefits and risks of the Services being provided via telemedicine.  I have been provided ample opportunity to ask questions regarding this consent and the Services and have had my questions answered to my satisfaction. I give my informed consent for the services to be provided through the use of telemedicine in my medical care

## 2023-08-24 NOTE — Telephone Encounter (Signed)
   Name: Blake Evans  DOB: 1950/11/09  MRN: [id number]  Primary Cardiologist: Nanetta Batty, MD   Preoperative team, please contact this patient and set up a phone call appointment for further preoperative risk assessment. Please obtain consent and complete medication review. Thank you for your help.  I confirm that guidance regarding antiplatelet and oral anticoagulation therapy has been completed and, if necessary, noted below.  Per office protocol, if patient is without any new symptoms or concerns at the time of their virtual visit, he may hold Plavix for 5 days prior to procedure. Please resume Plavix as soon as possible postprocedure, at the discretion of the surgeon.   Regarding ASA therapy, we recommend continuation of ASA throughout the perioperative period.    I also confirmed the patient resides in the state of [place]. As per [facility] Medical Board telemedicine laws, the patient must reside in the state in which the provider is licensed.   Denyce Robert, NP 08/24/2023, 11:56 AM [facility]

## 2023-08-24 NOTE — Progress Notes (Signed)
 Chief Complaint: Recall colonoscopy Primary GI MD: Dr. 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 - HYPERPLASTIC POLYP.  Today he states he as no issues and is here for recall.  Past Medical History:  Diagnosis Date   Adenomatous colon polyp 2006   Allergy    SEASONAL   BPH (benign prostatic hyperplasia) 11/21/2011   CAD (coronary artery disease), Residual disease of LAD and LCX 11/21/2011   myoview 01/09/12-positive bruce treadmill test with ventricular bigemeny, 2-3 mm horizontal ST segment depressions in II,III, AVF at peak exercise   Cataract    Hyperlipidemia 11/25/2011   Kidney stone    S/P angioplasty with stent, to LAD/Diag and AV groove, LCX.  11/24/11 11/25/2011   STEMI (ST elevation myocardial infarction), ST elevation in inf. wall 11/21/2011    Past Surgical History:  Procedure Laterality Date   CATARACT EXTRACTION Bilateral    CORONARY ANGIOPLASTY WITH STENT PLACEMENT  11/21/11   inferior wall MI; PCI and stenting to distal RCA, Resolute DES 3x43mm   CORONARY ANGIOPLASTY WITH STENT PLACEMENT  11/24/11   distal LAD diag branch PCI and stenting with a resolute DES along with mid AV groove circ PCI with a resolute DES    LEFT HEART CATHETERIZATION WITH CORONARY ANGIOGRAM N/A 11/21/2011   Procedure: LEFT HEART CATHETERIZATION WITH CORONARY ANGIOGRAM;  Surgeon: [name], MD;  Location: [facility] CATH LAB;  Service: Cardiovascular;  Laterality: N/A;   NO PAST SURGERIES     PERCUTANEOUS CORONARY STENT INTERVENTION (PCI-S)  11/21/2011   Procedure: PERCUTANEOUS CORONARY STENT INTERVENTION (PCI-S);  Surgeon: [name], MD;  Location: [facility] CATH LAB;  Service:  Cardiovascular;;   PERCUTANEOUS CORONARY STENT INTERVENTION (PCI-S) N/A 11/24/2011   Procedure: PERCUTANEOUS CORONARY STENT INTERVENTION (PCI-S);  Surgeon: [name], MD;  Location: [facility] CATH LAB;  Service: Cardiovascular;  Laterality: N/A;    Current Outpatient Medications  Medication Sig Dispense Refill   alendronate (FOSAMAX) 70 MG tablet Take 70 mg by mouth once a week.     aspirin 81 MG tablet Take 81 mg by mouth 2 (two) times daily.      atorvastatin (LIPITOR) 40 MG tablet TAKE 1 TABLET BY MOUTH EVERY DAY 90 tablet 2   cholecalciferol (VITAMIN D3) 25 MCG (1000 UNIT) tablet Take 1,000 Units by mouth daily.     clopidogrel (PLAVIX) 75 MG tablet TAKE 1 TABLET (75 MG TOTAL) BY MOUTH DAILY. KEEP UPCOMING VISIT. 90 tablet 3   finasteride (PROSCAR) 5 MG tablet Take 1 tablet by mouth daily.     losartan (COZAAR) 25 MG tablet Take 25 mg by mouth daily.     Multiple Vitamin (MULTIVITAMIN) tablet Take 1 tablet by mouth daily.     PEG 3350-KCl-NaCl-NaSulf-MgSul (SUFLAVE) 178.7 g SOLR Take 1 kit by mouth once for 1 dose. 1 each 0   saw palmetto 80 MG capsule Take 80 mg by mouth daily.     tamsulosin (FLOMAX) 0.4 MG CAPS capsule Take 0.4 mg by mouth daily.     nitroGLYCERIN (NITROSTAT) 0.4 MG SL tablet PLACE 1 TABLET (0.4 MG TOTAL) UNDER THE TONGUE EVERY 5 (FIVE) MINUTES X 3 DOSES AS NEEDED FOR CHEST PAIN. (Patient not taking: Reported on  08/24/2023) 25 tablet 3   No current facility-administered medications for this visit.    Allergies as of 08/24/2023   (No Known Allergies)    Family History  Problem Relation Age of Onset   Coronary artery disease Father    Heart disease Father    Hyperlipidemia Father    Diabetes Mother    Diabetes Sister     Social History   Socioeconomic History   Marital status: Married    Spouse name: Not on file   Number of children: Not on file   Years of education: Not on file   Highest education level: Not on file  Occupational History   Not on file   Tobacco Use   Smoking status: Never   Smokeless tobacco: Never  Substance and Sexual Activity   Alcohol use: Yes    Alcohol/week: 0.0 standard drinks of alcohol    Comment: occasional   Drug use: No   Sexual activity: Yes    Birth control/protection: None  Other Topics Concern   Not on file  Social History Narrative   Retired as a Research scientist (medical) in [employer]   Married x 8 months   Two grown children - both live in [place]       Social Drivers of Health   Financial Resource Strain: Low Risk  (08/09/2021)   Received from [facility]   Overall Financial Resource Strain (CARDIA)    Difficulty of Paying Living Expenses: Not hard at all  Food Insecurity: No Food Insecurity (08/09/2021)   Received from [facility], [facility]   Hunger Vital Sign    Worried About Running Out of Food in the Last Year: Never true    Ran Out of Food in the Last Year: Never true  Transportation Needs: No Transportation Needs (08/09/2021)   Received from [facility], [facility]   [facility] - Transportation    In the past 12 months, has lack of transportation kept you from medical appointments or from getting medications?: No    Lack of Transportation (Non-Medical): No  Physical Activity: Sufficiently Active (08/03/2020)   Exercise Vital Sign    Days of Exercise per Week: 7 days    Minutes of Exercise per Session: 40 min  Stress: No Stress Concern Present (08/03/2020)   [employer] of Occupational Health - Occupational Stress Questionnaire    Feeling of Stress : Only a little  Social Connections: Socially Integrated (08/03/2020)   Social Connection and Isolation Panel [NHANES]    Frequency of Communication with Friends and Family: Twice a week    Frequency of Social Gatherings with Friends and Family: Twice a week    Attends Religious Services: 1 to 4 times per year    Active  Member of [employer] or Organizations: Yes    Attends Engineer, structural: More than 4 times per year    Marital Status: Married  Catering manager Violence: Not At Risk (08/03/2020)   Humiliation, Afraid, Rape, and Kick questionnaire    Fear of Current or Ex-Partner: No    Emotionally Abused: No    Physically Abused: No    Sexually Abused: No    Review of Systems:    Constitutional: No weight loss, fever, chills, weakness or fatigue HEENT: Eyes: No change in vision               Ears, Nose, Throat:  No change in hearing or congestion  Skin: No rash or itching Cardiovascular: No chest pain, chest pressure or palpitations   Respiratory: No SOB or cough Gastrointestinal: See HPI and otherwise negative Genitourinary: No dysuria or change in urinary frequency Neurological: No headache, dizziness or syncope Musculoskeletal: No new muscle or joint pain Hematologic: No bleeding or bruising Psychiatric: No history of depression or anxiety    Physical Exam:  Vital signs: BP 130/80 (BP Location: Left Arm, Patient Position: Sitting, Cuff Size: Normal)   Pulse 68   Ht 5\' 11"  (1.803 m)   Wt 163 lb (73.9 kg)   BMI 22.73 kg/m   Constitutional: NAD, Well developed, Well nourished, alert and cooperative Head:  Normocephalic and atraumatic. Eyes:   PEERL, EOMI. No icterus. Conjunctiva pink. Respiratory: Respirations even and unlabored. Lungs clear to auscultation bilaterally.   No wheezes, crackles, or rhonchi.  Cardiovascular:  Regular rate and rhythm. No peripheral edema, cyanosis or pallor.  Gastrointestinal:  Soft, nondistended, nontender. No rebound or guarding. Normal bowel sounds. No appreciable masses or hepatomegaly. Rectal:  Not performed.  Msk:  Symmetrical without gross deformities. Without edema, no deformity or joint abnormality.  Neurologic:  Alert and  oriented x4;  grossly normal neurologically.  Skin:   Dry and intact without significant lesions or rashes. Psychiatric:  Oriented to person, place and time. Demonstrates good judgement and reason without abnormal affect or behaviors.   RELEVANT LABS AND IMAGING: CBC    Component Value Date/Time   WBC 7.1 10/07/2016 1048   RBC 5.00 10/07/2016 1048   HGB 15.6 10/07/2016 1048   HCT 45.8 10/07/2016 1048   PLT 202.0 10/07/2016 1048   MCV 91.7 10/07/2016 1048   MCH 30.9 11/25/2011 0405   MCHC 34.1 10/07/2016 1048   RDW 13.2 10/07/2016 1048   LYMPHSABS 1.9 10/07/2016 1048   MONOABS 0.7 10/07/2016 1048   EOSABS 0.2 10/07/2016 1048   BASOSABS 0.1 10/07/2016 1048    CMP     Component Value Date/Time   NA 139 10/07/2016 1048   K 4.7 10/07/2016 1048   CL 103 10/07/2016 1048   CO2 32 10/07/2016 1048   GLUCOSE 101 (H) 10/07/2016 1048   BUN 15 10/07/2016 1048   CREATININE 0.82 10/07/2016 1048   CALCIUM 9.4 10/07/2016 1048   PROT 6.4 05/26/2023 1247   ALBUMIN 4.4 05/26/2023 1247   AST 24 05/26/2023 1247   ALT 23 05/26/2023 1247   ALKPHOS 48 05/26/2023 1247   BILITOT 1.7 (H) 05/26/2023 1247   GFRNONAA >90 11/25/2011 0405   GFRAA >90 11/25/2011 0405     Assessment/Plan:   History of colon polyps Colonoscopy 2017 with sessile serrated adenoma and a recall of 5 years. On Plavix for history of STEMI/CAD s/p stenting in 2013 follows with Dr. Allyson Sabal. No GI symptoms - schedule colonoscopy - I thoroughly discussed the procedure with the patient (at bedside) to include nature of the procedure, alternatives, benefits, and risks (including but not limited to bleeding, infection, perforation, anesthesia/cardiac pulmonary complications).  Patient verbalized understanding and gave verbal consent to proceed with procedure.  - Will hold Plavix 5 days prior to endoscopic procedures - will instruct when and how to resume after procedure. Benefits and risks of procedure explained including risks of bleeding, perforation, infection, missed lesions, reactions to medications and possible need for hospitalization and surgery  for complications. Additional rare but real risk of stroke or other vascular clotting events off as we are also explained and need to seek urgent help if any signs of these problems  occur. Will communicate by phone or EMR with patient's  prescribing provider to confirm that holding Plavix is reasonable in this case.     Assigned to Dr. Tomasa Rand today  Boone Master, PA-C [facility] 08/24/2023, 12:19 PM  Cc: Melida Quitter, MD

## 2023-08-26 ENCOUNTER — Telehealth: Payer: Self-pay

## 2023-08-27 NOTE — Progress Notes (Signed)
 Agree with the assessment and plan as outlined by Boone Master, PA-C.

## 2023-09-01 NOTE — Telephone Encounter (Signed)
 Error

## 2023-09-14 ENCOUNTER — Ambulatory Visit: Attending: Cardiovascular Disease | Admitting: Emergency Medicine

## 2023-09-14 DIAGNOSIS — Z01810 Encounter for preprocedural cardiovascular examination: Secondary | ICD-10-CM | POA: Diagnosis not present

## 2023-09-14 NOTE — Progress Notes (Signed)
 Virtual Visit via Telephone Note   Because of Blake Evans co-morbid illnesses, he is at least at moderate risk for complications without adequate follow up.  This format is felt to be most appropriate for this patient at this time.  Due to technical limitations with video connection Web designer), today's appointment will be conducted as an audio only telehealth visit, and Rhian Funari verbally agreed to proceed in this manner.   All issues noted in this document were discussed and addressed.  No physical exam could be performed with this format.  Evaluation Performed:  Preoperative cardiovascular risk assessment _____________   Date:  09/14/2023   Patient ID:  Blake Evans, DOB 08/24/1950, MRN [id number] Patient Location:  Home Provider location:   Office  Primary Care Provider:  Melida Quitter, MD Primary Cardiologist:  Nanetta Batty, MD  Chief Complaint / Patient Profile   73 y.o. y/o male with a h/o hyperlipidemia, STEMI, hypertension, carotid artery disease who is pending colonoscopy with [facility] on 09/30/2023 and presents today for telephonic preoperative cardiovascular risk assessment.  History of Present Illness    Blake Evans is a 73 y.o. male who presents via audio/video conferencing for a telehealth visit today.  Pt was last seen in cardiology clinic on 05/26/2023 by Dr. Allyson Sabal.  At that time Kailan Laws was doing well.  The patient is now pending procedure as outlined above. Since his last visit, he denies shortness of breath, lower extremity edema, fatigue, palpitations, melena, hematuria, hemoptysis, diaphoresis, weakness, presyncope, syncope, orthopnea, and PND.  He noted 1 episode of nonexertional chest pain that lasted less than 1 minute.  This occurred 1 week ago.  This episode occurred at rest without any radiation of his pain.  He has had no reoccurrences of chest pain.  His chest pain is atypical, there is no indication for further evaluation at this  time.  He continues to stay very active.  He denies any exertional angina on activity.  Past Medical History    Past Medical History:  Diagnosis Date   Adenomatous colon polyp 2006   Allergy    SEASONAL   BPH (benign prostatic hyperplasia) 11/21/2011   CAD (coronary artery disease), Residual disease of LAD and LCX 11/21/2011   myoview 01/09/12-positive bruce treadmill test with ventricular bigemeny, 2-3 mm horizontal ST segment depressions in II,III, AVF at peak exercise   Cataract    Hyperlipidemia 11/25/2011   Kidney stone    S/P angioplasty with stent, to LAD/Diag and AV groove, LCX.  11/24/11 11/25/2011   STEMI (ST elevation myocardial infarction), ST elevation in inf. wall 11/21/2011   Past Surgical History:  Procedure Laterality Date   CATARACT EXTRACTION Bilateral    CORONARY ANGIOPLASTY WITH STENT PLACEMENT  11/21/11   inferior wall MI; PCI and stenting to distal RCA, Resolute DES 3x61mm   CORONARY ANGIOPLASTY WITH STENT PLACEMENT  11/24/11   distal LAD diag branch PCI and stenting with a resolute DES along with mid AV groove circ PCI with a resolute DES    LEFT HEART CATHETERIZATION WITH CORONARY ANGIOGRAM N/A 11/21/2011   Procedure: LEFT HEART CATHETERIZATION WITH CORONARY ANGIOGRAM;  Surgeon: [name], MD;  Location: [facility] CATH LAB;  Service: Cardiovascular;  Laterality: N/A;   NO PAST SURGERIES     PERCUTANEOUS CORONARY STENT INTERVENTION (PCI-S)  11/21/2011   Procedure: PERCUTANEOUS CORONARY STENT INTERVENTION (PCI-S);  Surgeon: [name], MD;  Location: [facility] CATH LAB;  Service: Cardiovascular;;   PERCUTANEOUS  CORONARY STENT INTERVENTION (PCI-S) N/A 11/24/2011   Procedure: PERCUTANEOUS CORONARY STENT INTERVENTION (PCI-S);  Surgeon: [name], MD;  Location: [facility] CATH LAB;  Service: Cardiovascular;  Laterality: N/A;    Allergies  No Known Allergies  Home Medications    Prior to Admission medications   Medication Sig Start Date End Date Taking? Authorizing  Provider  alendronate (FOSAMAX) 70 MG tablet Take 70 mg by mouth once a week.    [provider]  aspirin 81 MG tablet Take 81 mg by mouth 2 (two) times daily.     [provider]  atorvastatin (LIPITOR) 40 MG tablet TAKE 1 TABLET BY MOUTH EVERY DAY 08/19/23   [name], MD  cholecalciferol (VITAMIN D3) 25 MCG (1000 UNIT) tablet Take 1,000 Units by mouth daily.    [provider]  clopidogrel (PLAVIX) 75 MG tablet TAKE 1 TABLET (75 MG TOTAL) BY MOUTH DAILY. KEEP UPCOMING VISIT. 06/22/23   [name], MD  finasteride (PROSCAR) 5 MG tablet Take 1 tablet by mouth daily. 02/09/14   [provider]  losartan (COZAAR) 25 MG tablet Take 25 mg by mouth daily.    [provider]  Multiple Vitamin (MULTIVITAMIN) tablet Take 1 tablet by mouth daily.    [provider]  nitroGLYCERIN (NITROSTAT) 0.4 MG SL tablet PLACE 1 TABLET (0.4 MG TOTAL) UNDER THE TONGUE EVERY 5 (FIVE) MINUTES X 3 DOSES AS NEEDED FOR CHEST PAIN. Patient not taking: Reported on 08/24/2023 03/10/22   [name], MD  saw palmetto 80 MG capsule Take 80 mg by mouth daily.    [provider]  tamsulosin (FLOMAX) 0.4 MG CAPS capsule Take 0.4 mg by mouth daily.    [provider]    Physical Exam    Vital Signs:  Nickoli Bagheri does not have vital signs available for review today.  Given telephonic nature of communication, physical exam is limited. AAOx3. NAD. Normal affect.  Speech and respirations are unlabored.  Accessory Clinical Findings    None  Assessment & Plan    1.  Preoperative Cardiovascular Risk Assessment: According to the Revised Cardiac Risk Index (RCRI), his Perioperative Risk of Major Cardiac Event is (%): 0.9. His Functional Capacity in METs is: 8.97 according to the Duke Activity Status Index (DASI). Therefore, based on ACC/AHA guidelines, patient would be at acceptable risk for the planned procedure without further  cardiovascular testing.   The patient was advised that if he develops new symptoms prior to surgery to contact our office to arrange for a follow-up visit, and he verbalized understanding.  He may hold Plavix for 5 days prior to procedure. Please resume Plavix as soon as possible postprocedure, at the discretion of the surgeon.    Regarding ASA therapy, we recommend continuation of ASA throughout the perioperative period.    A copy of this note will be routed to requesting surgeon.  Time:   Today, I have spent 8 minutes with the patient with telehealth technology discussing medical history, symptoms, and management plan.     Denyce Robert, NP  09/14/2023, 8:48 AM

## 2023-09-14 NOTE — Telephone Encounter (Signed)
 Contacted patient and patient verbalized understanding to hold Plavix 5 days prior to her procedure per Cardiology.

## 2023-09-30 ENCOUNTER — Encounter: Payer: Self-pay | Admitting: Gastroenterology

## 2023-09-30 ENCOUNTER — Ambulatory Visit (AMBULATORY_SURGERY_CENTER): Admitting: Gastroenterology

## 2023-09-30 VITALS — BP 112/58 | HR 58 | Temp 97.5°F | Resp 13 | Ht 71.0 in | Wt 163.0 lb

## 2023-09-30 DIAGNOSIS — Z860100 Personal history of colon polyps, unspecified: Secondary | ICD-10-CM | POA: Diagnosis not present

## 2023-09-30 DIAGNOSIS — K635 Polyp of colon: Secondary | ICD-10-CM | POA: Diagnosis not present

## 2023-09-30 DIAGNOSIS — Z1211 Encounter for screening for malignant neoplasm of colon: Secondary | ICD-10-CM

## 2023-09-30 DIAGNOSIS — D122 Benign neoplasm of ascending colon: Secondary | ICD-10-CM

## 2023-09-30 DIAGNOSIS — D123 Benign neoplasm of transverse colon: Secondary | ICD-10-CM | POA: Diagnosis not present

## 2023-09-30 MED ORDER — SODIUM CHLORIDE 0.9 % IV SOLN: 500.0000 mL | Freq: Once | INTRAVENOUS | Status: DC

## 2023-09-30 NOTE — Progress Notes (Signed)
 VS by DT  Pt's states no medical or surgical changes since previsit or office visit.

## 2023-09-30 NOTE — Op Note (Addendum)
 [facility] Patient Name: Blake Evans Procedure Date: 09/30/2023 9:10 AM MRN: [id number] Endoscopist: Geralyn Knee E. Cherryl Corona , MD, [id number] Age: 73 Referring MD:  Date of Birth: [birth year] Gender: Male Account #: [id number] Procedure:                Colonoscopy Indications:              High risk colon cancer surveillance: Personal                            history of sessile serrated colon polyp (less than                            10 mm in size) with no dysplasia, Last colonoscopy:                            May 2017 Medicines:                Monitored Anesthesia Care Procedure:                Pre-Anesthesia Assessment:                           - Prior to the procedure, a History and Physical                            was performed, and patient medications and                            allergies were reviewed. The patient's tolerance of                            previous anesthesia was also reviewed. The risks                            and benefits of the procedure and the sedation                            options and risks were discussed with the patient.                            All questions were answered, and informed consent                            was obtained. Prior Anticoagulants: The patient has                            taken Plavix  (clopidogrel ), last dose was 5 days                            prior to procedure. ASA Grade Assessment: III - A                            patient with severe systemic disease. After  reviewing the risks and benefits, the patient was                            deemed in satisfactory condition to undergo the                            procedure.                           After obtaining informed consent, the colonoscope                            was passed under direct vision. Throughout the                            procedure, the patient's blood pressure, pulse, and                             oxygen saturations were monitored continuously. The                            Olympus Scope SN: [id number] was introduced through                            the anus and advanced to the the terminal ileum,                            with identification of the appendiceal orifice and                            IC valve. The colonoscopy was performed without                            difficulty. The patient tolerated the procedure                            well. The quality of the bowel preparation was                            good. The terminal ileum, ileocecal valve,                            appendiceal orifice, and rectum were photographed.                            The bowel preparation used was SUFLAVE  via split                            dose instruction. Scope In: 9:20:35 AM Scope Out: 9:42:03 AM Scope Withdrawal Time: 0 hours 17 minutes 23 seconds  Total Procedure Duration: 0 hours 21 minutes 28 seconds  Findings:                 The perianal and digital rectal examinations were  normal. Pertinent negatives include normal                            sphincter tone and no palpable rectal lesions.                           Two sessile polyps were found in the ascending                            colon. The polyps were 3 to 4 mm in size. These                            polyps were removed with a cold snare. Resection                            and retrieval were complete. Estimated blood loss                            was minimal.                           A 5 mm polyp was found in the transverse colon. The                            polyp was sessile. The polyp was removed with a                            cold snare. Resection and retrieval were complete.                            Estimated blood loss was minimal. Estimated blood                            loss was minimal.                           The exam was otherwise normal throughout the                             examined colon.                           The terminal ileum appeared normal.                           The retroflexed view of the distal rectum and anal                            verge was normal and showed no anal or rectal                            abnormalities. Complications:            No immediate complications. Estimated Blood Loss:     Estimated blood loss was minimal.  Impression:               - Two 3 to 4 mm polyps in the ascending colon,                            removed with a cold snare. Resected and retrieved.                           - One 5 mm polyp in the transverse colon, removed                            with a cold snare. Resected and retrieved.                           - The examined portion of the ileum was normal.                           - The distal rectum and anal verge are normal on                            retroflexion view. Recommendation:           - Patient has a contact number available for                            emergencies. The signs and symptoms of potential                            delayed complications were discussed with the                            patient. Return to normal activities tomorrow.                            Written discharge instructions were provided to the                            patient.                           - Resume previous diet.                           - Continue present medications.                           - Await pathology results.                           - Repeat colonoscopy (date not yet determined) for                            surveillance based on pathology results.                           - Resume Plavix  (clopidogrel ) at prior  dose                            tomorrow. Cinda Hara E. Cherryl Corona, MD 09/30/2023 9:49:58 AM This report has been signed electronically.

## 2023-09-30 NOTE — Patient Instructions (Addendum)
 Await pathology results.  Resume previous diet and medications.  Resume Plavix  tomorrow.    YOU HAD AN ENDOSCOPIC PROCEDURE TODAY AT THE [facility]:   Refer to the procedure report that was given to you for any specific questions about what was found during the examination.  If the procedure report does not answer your questions, please call your gastroenterologist to clarify.  If you requested that your care partner not be given the details of your procedure findings, then the procedure report has been included in a sealed envelope for you to review at your convenience later.  YOU SHOULD EXPECT: Some feelings of bloating in the abdomen. Passage of more gas than usual.  Walking can help get rid of the air that was put into your GI tract during the procedure and reduce the bloating. If you had a lower endoscopy (such as a colonoscopy or flexible sigmoidoscopy) you may notice spotting of blood in your stool or on the toilet paper. If you underwent a bowel prep for your procedure, you may not have a normal bowel movement for a few days.  Please Note:  You might notice some irritation and congestion in your nose or some drainage.  This is from the oxygen used during your procedure.  There is no need for concern and it should clear up in a day or so.  SYMPTOMS TO REPORT IMMEDIATELY:  Following lower endoscopy (colonoscopy or flexible sigmoidoscopy):  Excessive amounts of blood in the stool  Significant tenderness or worsening of abdominal pains  Swelling of the abdomen that is new, acute  Fever of 100F or higher  For urgent or emergent issues, a gastroenterologist can be reached at any hour by calling [phone]. Do not use MyChart messaging for urgent concerns.    DIET:  We do recommend a small meal at first, but then you may proceed to your regular diet.  Drink plenty of fluids but you should avoid alcoholic beverages for 24 hours.  ACTIVITY:  You should plan to take it easy  for the rest of today and you should NOT DRIVE or use heavy machinery until tomorrow (because of the sedation medicines used during the test).    FOLLOW UP: Our staff will call the number listed on your records the next business day following your procedure.  We will call around 7:15- 8:00 am to check on you and address any questions or concerns that you may have regarding the information given to you following your procedure. If we do not reach you, we will leave a message.     If any biopsies were taken you will be contacted by phone or by letter within the next 1-3 weeks.  Please call us  at [phone] if you have not heard about the biopsies in 3 weeks.    SIGNATURES/CONFIDENTIALITY: You and/or your care partner have signed paperwork which will be entered into your electronic medical record.  These signatures attest to the fact that that the information above on your After Visit Summary has been reviewed and is understood.  Full responsibility of the confidentiality of this discharge information lies with you and/or your care-partner.

## 2023-09-30 NOTE — Progress Notes (Signed)
 [facility] History and Physical   Primary Care Physician:  Azalia Leo, MD   Reason for Procedure:   Colon cancer screening/polyp surveillance  Plan:    Surveillance colonoscopy     HPI: Blake Evans is a 73 y.o. male undergoing surveillance colonoscopy.  He has no family history of colon cancer and no chronic GI symptoms.   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 - HYPERPLASTIC POLYP.   Past Medical History:  Diagnosis Date   Adenomatous colon polyp 2006   Allergy    SEASONAL   BPH (benign prostatic hyperplasia) 11/21/2011   CAD (coronary artery disease), Residual disease of LAD and LCX 11/21/2011   myoview 01/09/12-positive bruce treadmill test with ventricular bigemeny, 2-3 mm horizontal ST segment depressions in II,III, AVF at peak exercise   Cataract    Hyperlipidemia 11/25/2011   Kidney stone    S/P angioplasty with stent, to LAD/Diag and AV groove, LCX.  11/24/11 11/25/2011   STEMI (ST elevation myocardial infarction), ST elevation in inf. wall 11/21/2011    Past Surgical History:  Procedure Laterality Date   CATARACT EXTRACTION Bilateral    CORONARY ANGIOPLASTY WITH STENT PLACEMENT  11/21/11   inferior wall MI; PCI and stenting to distal RCA, Resolute DES 3x68mm   CORONARY ANGIOPLASTY WITH STENT PLACEMENT  11/24/11   distal LAD diag branch PCI and stenting with a resolute DES along with mid AV groove circ PCI with a resolute DES    LEFT HEART CATHETERIZATION WITH CORONARY ANGIOGRAM N/A 11/21/2011   Procedure: LEFT HEART CATHETERIZATION WITH CORONARY ANGIOGRAM;  Surgeon: Avanell Leigh, MD;  Location: [facility] CATH LAB;  Service: Cardiovascular;  Laterality: N/A;   NO PAST SURGERIES      PERCUTANEOUS CORONARY STENT INTERVENTION (PCI-S)  11/21/2011   Procedure: PERCUTANEOUS CORONARY STENT INTERVENTION (PCI-S);  Surgeon: Avanell Leigh, MD;  Location: [facility] CATH LAB;  Service: Cardiovascular;;   PERCUTANEOUS CORONARY STENT INTERVENTION (PCI-S) N/A 11/24/2011   Procedure: PERCUTANEOUS CORONARY STENT INTERVENTION (PCI-S);  Surgeon: Avanell Leigh, MD;  Location: [facility] CATH LAB;  Service: Cardiovascular;  Laterality: N/A;    Prior to Admission medications   Medication Sig Start Date End Date Taking? Authorizing Provider  alendronate (FOSAMAX) 70 MG tablet Take 70 mg by mouth once a week.   Yes [provider]  aspirin  81 MG tablet Take 81 mg by mouth 2 (two) times daily.    Yes [provider]  atorvastatin  (LIPITOR) 40 MG tablet TAKE 1 TABLET BY MOUTH EVERY DAY 08/19/23  Yes Avanell Leigh, MD  cholecalciferol (VITAMIN D3) 25 MCG (1000 UNIT) tablet Take 1,000 Units by mouth daily.   Yes [provider]  finasteride (PROSCAR) 5 MG tablet Take 1 tablet by mouth daily. 02/09/14  Yes [provider]  losartan (COZAAR) 25 MG tablet Take 25 mg by mouth daily.   Yes [provider]  Multiple Vitamin (MULTIVITAMIN) tablet Take 1 tablet by mouth daily.   Yes [provider]  saw palmetto 80 MG capsule Take 80 mg by mouth daily.   Yes [provider]  tamsulosin  (FLOMAX ) 0.4 MG CAPS capsule Take 0.4 mg by mouth daily.   Yes [provider]  clopidogrel  (PLAVIX )  75 MG tablet TAKE 1 TABLET (75 MG TOTAL) BY MOUTH DAILY. KEEP UPCOMING VISIT. 06/22/23   Avanell Leigh, MD  nitroGLYCERIN  (NITROSTAT ) 0.4 MG SL tablet PLACE 1 TABLET (0.4 MG TOTAL) UNDER THE TONGUE EVERY 5 (FIVE) MINUTES X 3 DOSES AS NEEDED FOR CHEST PAIN. Patient not taking: Reported on 09/30/2023 03/10/22   Avanell Leigh, MD    Current Outpatient Medications  Medication Sig Dispense Refill   alendronate (FOSAMAX) 70 MG tablet Take 70 mg by mouth once a  week.     aspirin  81 MG tablet Take 81 mg by mouth 2 (two) times daily.      atorvastatin  (LIPITOR) 40 MG tablet TAKE 1 TABLET BY MOUTH EVERY DAY 90 tablet 2   cholecalciferol (VITAMIN D3) 25 MCG (1000 UNIT) tablet Take 1,000 Units by mouth daily.     finasteride (PROSCAR) 5 MG tablet Take 1 tablet by mouth daily.     losartan (COZAAR) 25 MG tablet Take 25 mg by mouth daily.     Multiple Vitamin (MULTIVITAMIN) tablet Take 1 tablet by mouth daily.     saw palmetto 80 MG capsule Take 80 mg by mouth daily.     tamsulosin  (FLOMAX ) 0.4 MG CAPS capsule Take 0.4 mg by mouth daily.     clopidogrel  (PLAVIX ) 75 MG tablet TAKE 1 TABLET (75 MG TOTAL) BY MOUTH DAILY. KEEP UPCOMING VISIT. 90 tablet 3   nitroGLYCERIN  (NITROSTAT ) 0.4 MG SL tablet PLACE 1 TABLET (0.4 MG TOTAL) UNDER THE TONGUE EVERY 5 (FIVE) MINUTES X 3 DOSES AS NEEDED FOR CHEST PAIN. (Patient not taking: Reported on 09/30/2023) 25 tablet 3   Current Facility-Administered Medications  Medication Dose Route Frequency Provider Last Rate Last Admin   0.9 %  sodium chloride  infusion  500 mL Intravenous Once Elois Hair, MD        Allergies as of 09/30/2023   (No Known Allergies)    Family History  Problem Relation Age of Onset   Diabetes Mother    Coronary artery disease Father    Heart disease Father    Hyperlipidemia Father    Diabetes Sister    Colon cancer Neg Hx    Esophageal cancer Neg Hx    Rectal cancer Neg Hx    Stomach cancer Neg Hx     Social History   Socioeconomic History   Marital status: Married    Spouse name: Not on file   Number of children: Not on file   Years of education: Not on file   Highest education level: Not on file  Occupational History   Not on file  Tobacco Use   Smoking status: Never   Smokeless tobacco: Never  Vaping Use   Vaping status: Never Used  Substance and Sexual Activity   Alcohol use: Yes    Alcohol/week: 0.0 standard drinks of alcohol    Comment: occasional   Drug use:  No   Sexual activity: Yes    Birth control/protection: None  Other Topics Concern   Not on file  Social History Narrative   Retired as a Research scientist (medical) in [employer]   Married x 8 months   Two grown children - both live in [place]       Social Drivers of Health   Financial Resource Strain: Low Risk  (08/09/2021)   Received from [facility]   Overall Financial Resource Strain (CARDIA)    Difficulty of Paying Living Expenses: Not hard at all  Food Insecurity: No Food Insecurity (08/09/2021)   Received from [facility], [facility]   Hunger Vital Sign    Worried About Running Out of Food in the Last Year: Never true    Ran Out of Food in the Last Year: Never true  Transportation Needs: No Transportation Needs (08/09/2021)   Received from [facility], [facility]   [facility] - Transportation    In the past 12 months, has lack of transportation kept you from medical appointments or from getting medications?: No    Lack of Transportation (Non-Medical): No  Physical Activity: Sufficiently Active (08/03/2020)   Exercise Vital Sign    Days of Exercise per Week: 7 days    Minutes of Exercise per Session: 40 min  Stress: No Stress Concern Present (08/03/2020)   [employer] of Occupational Health - Occupational Stress Questionnaire    Feeling of Stress : Only a little  Social Connections: Socially Integrated (08/03/2020)   Social Connection and Isolation Panel [NHANES]    Frequency of Communication with Friends and Family: Twice a week    Frequency of Social Gatherings with Friends and Family: Twice a week    Attends Religious Services: 1 to 4 times per year    Active Member of [employer] or Organizations: Yes    Attends Engineer, structural: More than 4 times per year    Marital Status: Married  Catering manager Violence: Not At Risk (08/03/2020)   Humiliation,  Afraid, Rape, and Kick questionnaire    Fear of Current or Ex-Partner: No    Emotionally Abused: No    Physically Abused: No    Sexually Abused: No    Review of Systems:  All other review of systems negative except as mentioned in the HPI.  Physical Exam: Vital signs BP 132/78   Pulse (!) 56   Temp (!) 97.5 F (36.4 C)   Ht 5\' 11"  (1.803 m)   Wt 163 lb (73.9 kg)   SpO2 98%   BMI 22.73 kg/m   General:   Alert,  Well-developed, well-nourished, pleasant and cooperative in NAD Airway:  Mallampati 2 Lungs:  Clear throughout to auscultation.   Heart:  Regular rate and rhythm; no murmurs, clicks, rubs,  or gallops. Abdomen:  Soft, nontender and nondistended. Normal bowel sounds.   Neuro/Psych:  Normal mood and affect. A and O x 3   Almendra Loria E. Cherryl Corona, MD [facility]

## 2023-09-30 NOTE — Progress Notes (Signed)
 Vss nad trans to pacu

## 2023-09-30 NOTE — Progress Notes (Signed)
 Called to room to assist during endoscopic procedure.  Patient ID and intended procedure confirmed with present staff. Received instructions for my participation in the procedure from the performing physician.

## 2023-10-01 ENCOUNTER — Telehealth: Payer: Self-pay

## 2023-10-01 NOTE — Telephone Encounter (Signed)
Follow up call placed, VM obtained and message left. 

## 2023-10-02 LAB — SURGICAL PATHOLOGY

## 2023-10-07 ENCOUNTER — Encounter: Payer: Self-pay | Admitting: Gastroenterology

## 2023-10-07 NOTE — Progress Notes (Signed)
 Blake Evans,   The three polyps that I removed during your recent procedure were completely benign but were proven to be "pre-cancerous" polyps that MAY have grown into cancers if they had not been removed.  Studies shows that at least 20% of women over age 75 and 30% of men over age 11 have pre-cancerous polyps.  Based on current nationally recognized surveillance guidelines, I recommend that you have a repeat colonoscopy in 5 years.   If you develop any new rectal bleeding, abdominal pain or significant bowel habit changes, please contact me before then.

## 2024-06-17 ENCOUNTER — Other Ambulatory Visit: Payer: Self-pay | Admitting: Cardiovascular Disease

## 2024-06-21 ENCOUNTER — Ambulatory Visit (HOSPITAL_COMMUNITY)
Admission: RE | Admit: 2024-06-21 | Discharge: 2024-06-21 | Disposition: A | Discharge status: Home / Self Care | Source: Ambulatory Visit | Attending: Cardiovascular Disease | Admitting: Cardiovascular Disease

## 2024-06-21 DIAGNOSIS — I6522 Occlusion and stenosis of left carotid artery: Secondary | ICD-10-CM | POA: Diagnosis present

## 2024-06-21 DIAGNOSIS — I6523 Occlusion and stenosis of bilateral carotid arteries: Secondary | ICD-10-CM | POA: Diagnosis not present

## 2024-06-22 ENCOUNTER — Ambulatory Visit: Payer: Self-pay | Admitting: Cardiovascular Disease

## 2024-06-22 DIAGNOSIS — I6523 Occlusion and stenosis of bilateral carotid arteries: Secondary | ICD-10-CM

## 2024-06-22 DIAGNOSIS — I6522 Occlusion and stenosis of left carotid artery: Secondary | ICD-10-CM

## 2024-07-01 ENCOUNTER — Other Ambulatory Visit: Payer: Self-pay | Admitting: Cardiovascular Disease

## 2024-07-06 ENCOUNTER — Telehealth: Payer: Self-pay | Admitting: Cardiovascular Disease

## 2024-07-06 MED ORDER — CLOPIDOGREL BISULFATE 75 MG PO TABS: ORAL_TABLET | ORAL | 0 refills | Status: AC

## 2024-07-06 NOTE — Telephone Encounter (Signed)
" °*  STAT* If patient is at the pharmacy, call can be transferred to refill team.   1. Which medications need to be refilled? (please list name of each medication and dose if known)   clopidogrel  (PLAVIX ) 75 MG tablet    2. Would you like to learn more about the convenience, safety, & potential cost savings by using the [facility]? no   3. Are you open to using the [facility] (Type [facility]. No    4. Which pharmacy/location (including street and city if local pharmacy) is medication to be sent to? [id number] [place] [place], [place] - [address]     5. Do they need a 30 day or 90 day supply?  90 day  "

## 2024-07-06 NOTE — Telephone Encounter (Signed)
 Left message. Refill sent in, pt has f/u with Dr Court in Feb.

## 2024-08-03 ENCOUNTER — Ambulatory Visit: Admitting: Cardiovascular Disease
